# Patient Record
Sex: Female | Born: 1960 | Race: White | Hispanic: No | Smoking: Former smoker
Health system: Southern US, Community
[De-identification: ages and names within clinical notes are randomized; demographics above are authoritative.]

## PROBLEM LIST (undated history)

## (undated) DIAGNOSIS — E785 Hyperlipidemia, unspecified: Secondary | ICD-10-CM

## (undated) DIAGNOSIS — I10 Essential (primary) hypertension: Secondary | ICD-10-CM

## (undated) HISTORY — PX: ABDOMINAL HYSTERECTOMY: SHX81

## (undated) HISTORY — DX: Hyperlipidemia, unspecified: E78.5

## (undated) HISTORY — PX: TONSILLECTOMY: SUR1361

## (undated) HISTORY — DX: Essential (primary) hypertension: I10

---

## 2004-02-17 ENCOUNTER — Ambulatory Visit: Payer: Self-pay | Admitting: Family Medicine

## 2004-02-20 ENCOUNTER — Ambulatory Visit: Payer: Self-pay | Admitting: Family Medicine

## 2004-03-08 ENCOUNTER — Ambulatory Visit: Payer: Self-pay | Admitting: Family Medicine

## 2005-08-15 ENCOUNTER — Ambulatory Visit: Payer: Self-pay | Admitting: Obstetrics and Gynecology

## 2007-03-16 ENCOUNTER — Ambulatory Visit: Payer: Self-pay | Admitting: Obstetrics and Gynecology

## 2007-04-21 ENCOUNTER — Ambulatory Visit: Payer: Self-pay | Admitting: Obstetrics and Gynecology

## 2007-04-26 ENCOUNTER — Inpatient Hospital Stay: Payer: Self-pay | Admitting: Obstetrics and Gynecology

## 2008-01-28 ENCOUNTER — Ambulatory Visit: Payer: Self-pay | Admitting: Internal Medicine

## 2009-02-06 ENCOUNTER — Ambulatory Visit: Payer: Self-pay | Admitting: Internal Medicine

## 2010-11-25 ENCOUNTER — Ambulatory Visit: Payer: Self-pay | Admitting: Internal Medicine

## 2011-01-17 ENCOUNTER — Ambulatory Visit: Payer: Self-pay

## 2012-01-23 ENCOUNTER — Ambulatory Visit: Payer: Self-pay | Admitting: Internal Medicine

## 2014-01-13 ENCOUNTER — Ambulatory Visit: Payer: Self-pay | Admitting: Internal Medicine

## 2014-12-04 ENCOUNTER — Ambulatory Visit
Admission: RE | Admit: 2014-12-04 | Discharge: 2014-12-04 | Disposition: A | Discharge status: Home / Self Care | Payer: BLUE CROSS/BLUE SHIELD | Source: Ambulatory Visit | Attending: Family Medicine | Admitting: Family Medicine

## 2014-12-04 ENCOUNTER — Other Ambulatory Visit: Payer: Self-pay | Admitting: Family Medicine

## 2014-12-04 DIAGNOSIS — R030 Elevated blood-pressure reading, without diagnosis of hypertension: Secondary | ICD-10-CM

## 2015-02-28 ENCOUNTER — Other Ambulatory Visit: Payer: Self-pay | Admitting: Internal Medicine

## 2015-02-28 DIAGNOSIS — Z1231 Encounter for screening mammogram for malignant neoplasm of breast: Secondary | ICD-10-CM

## 2015-05-18 ENCOUNTER — Ambulatory Visit: Payer: BLUE CROSS/BLUE SHIELD | Attending: Internal Medicine

## 2015-08-07 ENCOUNTER — Other Ambulatory Visit: Payer: Self-pay | Admitting: Rheumatology

## 2015-08-07 DIAGNOSIS — M545 Low back pain, unspecified: Secondary | ICD-10-CM

## 2015-08-07 DIAGNOSIS — G8929 Other chronic pain: Secondary | ICD-10-CM

## 2015-08-23 ENCOUNTER — Ambulatory Visit: Payer: BLUE CROSS/BLUE SHIELD

## 2015-09-24 ENCOUNTER — Ambulatory Visit: Payer: BLUE CROSS/BLUE SHIELD

## 2016-10-21 ENCOUNTER — Other Ambulatory Visit: Payer: Self-pay | Admitting: Obstetrics and Gynecology

## 2016-10-21 DIAGNOSIS — Z1231 Encounter for screening mammogram for malignant neoplasm of breast: Secondary | ICD-10-CM

## 2016-11-21 ENCOUNTER — Ambulatory Visit
Admission: RE | Admit: 2016-11-21 | Discharge: 2016-11-21 | Disposition: A | Discharge status: Home / Self Care | Payer: 59 | Source: Ambulatory Visit | Attending: Obstetrics and Gynecology | Admitting: Obstetrics and Gynecology

## 2016-11-21 ENCOUNTER — Encounter: Payer: Self-pay | Admitting: Radiology

## 2016-11-21 DIAGNOSIS — Z1231 Encounter for screening mammogram for malignant neoplasm of breast: Secondary | ICD-10-CM

## 2017-07-16 ENCOUNTER — Ambulatory Visit
Admission: RE | Admit: 2017-07-16 | Discharge: 2017-07-16 | Disposition: A | Discharge status: Home / Self Care | Payer: 59 | Source: Ambulatory Visit | Attending: Internal Medicine | Admitting: Internal Medicine

## 2017-07-16 ENCOUNTER — Other Ambulatory Visit: Payer: Self-pay | Admitting: Internal Medicine

## 2017-07-16 DIAGNOSIS — R600 Localized edema: Secondary | ICD-10-CM | POA: Diagnosis not present

## 2017-07-16 DIAGNOSIS — L539 Erythematous condition, unspecified: Secondary | ICD-10-CM | POA: Diagnosis present

## 2017-09-04 ENCOUNTER — Other Ambulatory Visit: Payer: Self-pay | Admitting: Sports Medicine

## 2017-09-04 DIAGNOSIS — R2241 Localized swelling, mass and lump, right lower limb: Secondary | ICD-10-CM

## 2017-09-04 DIAGNOSIS — M67461 Ganglion, right knee: Secondary | ICD-10-CM

## 2017-09-04 DIAGNOSIS — M7121 Synovial cyst of popliteal space [Baker], right knee: Secondary | ICD-10-CM

## 2017-09-04 DIAGNOSIS — M25561 Pain in right knee: Secondary | ICD-10-CM

## 2017-09-06 ENCOUNTER — Ambulatory Visit
Admission: RE | Admit: 2017-09-06 | Discharge: 2017-09-06 | Disposition: A | Discharge status: Home / Self Care | Payer: 59 | Source: Ambulatory Visit | Attending: Sports Medicine | Admitting: Sports Medicine

## 2017-09-06 ENCOUNTER — Ambulatory Visit
Admission: RE | Admit: 2017-09-06 | Discharge: 2017-09-06 | Disposition: A | Discharge status: Home / Self Care | Payer: BLUE CROSS/BLUE SHIELD | Source: Ambulatory Visit | Attending: Sports Medicine | Admitting: Sports Medicine

## 2017-09-06 DIAGNOSIS — M25561 Pain in right knee: Secondary | ICD-10-CM

## 2017-09-06 DIAGNOSIS — M7121 Synovial cyst of popliteal space [Baker], right knee: Secondary | ICD-10-CM

## 2017-09-06 DIAGNOSIS — R2241 Localized swelling, mass and lump, right lower limb: Secondary | ICD-10-CM

## 2017-09-06 DIAGNOSIS — M67461 Ganglion, right knee: Secondary | ICD-10-CM

## 2017-09-06 MED ORDER — GADOBENATE DIMEGLUMINE 529 MG/ML IV SOLN
18.0000 mL | Freq: Once | INTRAVENOUS | Status: AC | PRN
  Administered 2017-09-06: 18 mL via INTRAVENOUS

## 2018-01-21 ENCOUNTER — Other Ambulatory Visit: Payer: Self-pay

## 2018-01-21 ENCOUNTER — Encounter: Payer: Self-pay | Admitting: Student in an Organized Health Care Education/Training Program

## 2018-01-21 ENCOUNTER — Ambulatory Visit
Payer: 59 | Attending: Student in an Organized Health Care Education/Training Program | Admitting: Student in an Organized Health Care Education/Training Program

## 2018-01-21 VITALS — BP 108/73 | HR 74 | Temp 98.0°F | Resp 16 | Ht 64.0 in | Wt 207.0 lb

## 2018-01-21 DIAGNOSIS — I10 Essential (primary) hypertension: Secondary | ICD-10-CM | POA: Diagnosis not present

## 2018-01-21 DIAGNOSIS — Z885 Allergy status to narcotic agent status: Secondary | ICD-10-CM | POA: Insufficient documentation

## 2018-01-21 DIAGNOSIS — E785 Hyperlipidemia, unspecified: Secondary | ICD-10-CM | POA: Insufficient documentation

## 2018-01-21 DIAGNOSIS — E669 Obesity, unspecified: Secondary | ICD-10-CM | POA: Diagnosis not present

## 2018-01-21 DIAGNOSIS — M25561 Pain in right knee: Secondary | ICD-10-CM | POA: Diagnosis not present

## 2018-01-21 DIAGNOSIS — Z880 Allergy status to penicillin: Secondary | ICD-10-CM | POA: Diagnosis not present

## 2018-01-21 DIAGNOSIS — Z6835 Body mass index (BMI) 35.0-35.9, adult: Secondary | ICD-10-CM

## 2018-01-21 DIAGNOSIS — Z87891 Personal history of nicotine dependence: Secondary | ICD-10-CM | POA: Diagnosis not present

## 2018-01-21 DIAGNOSIS — Z9889 Other specified postprocedural states: Secondary | ICD-10-CM | POA: Diagnosis not present

## 2018-01-21 DIAGNOSIS — Z90710 Acquired absence of both cervix and uterus: Secondary | ICD-10-CM | POA: Diagnosis not present

## 2018-01-21 DIAGNOSIS — M1711 Unilateral primary osteoarthritis, right knee: Secondary | ICD-10-CM | POA: Insufficient documentation

## 2018-01-21 DIAGNOSIS — Z79899 Other long term (current) drug therapy: Secondary | ICD-10-CM | POA: Insufficient documentation

## 2018-01-21 DIAGNOSIS — G894 Chronic pain syndrome: Secondary | ICD-10-CM | POA: Insufficient documentation

## 2018-01-21 DIAGNOSIS — G8929 Other chronic pain: Secondary | ICD-10-CM

## 2018-01-21 NOTE — Patient Instructions (Signed)
____________________________________________________________________________________________  Preparing for Procedure with Sedation  Instructions: . Oral Intake: Do not eat or drink anything for at least 8 hours prior to your procedure. . Transportation: Public transportation is not allowed. Bring an adult driver. The driver must be physically present in our waiting room before any procedure can be started. . Physical Assistance: Bring an adult physically capable of assisting you, in the event you need help. This adult should keep you company at home for at least 6 hours after the procedure. . Blood Pressure Medicine: Take your blood pressure medicine with a sip of water the morning of the procedure. . Blood thinners: Notify our staff if you are taking any blood thinners. Depending on which one you take, there will be specific instructions on how and when to stop it. . Diabetics on insulin: Notify the staff so that you can be scheduled 1st case in the morning. If your diabetes requires high dose insulin, take only  of your normal insulin dose the morning of the procedure and notify the staff that you have done so. . Preventing infections: Shower with an antibacterial soap the morning of your procedure. . Build-up your immune system: Take 1000 mg of Vitamin C with every meal (3 times a day) the day prior to your procedure. . Antibiotics: Inform the staff if you have a condition or reason that requires you to take antibiotics before dental procedures. . Pregnancy: If you are pregnant, call and cancel the procedure. . Sickness: If you have a cold, fever, or any active infections, call and cancel the procedure. . Arrival: You must be in the facility at least 30 minutes prior to your scheduled procedure. . Children: Do not bring children with you. . Dress appropriately: Bring dark clothing that you would not mind if they get stained. . Valuables: Do not bring any jewelry or valuables.  Procedure  appointments are reserved for interventional treatments only. . No Prescription Refills. . No medication changes will be discussed during procedure appointments. . No disability issues will be discussed.  Reasons to call and reschedule or cancel your procedure: (Following these recommendations will minimize the risk of a serious complication.) . Surgeries: Avoid having procedures within 2 weeks of any surgery. (Avoid for 2 weeks before or after any surgery). . Flu Shots: Avoid having procedures within 2 weeks of a flu shots or . (Avoid for 2 weeks before or after immunizations). . Barium: Avoid having a procedure within 7-10 days after having had a radiological study involving the use of radiological contrast. (Myelograms, Barium swallow or enema study). . Heart attacks: Avoid any elective procedures or surgeries for the initial 6 months after a "Myocardial Infarction" (Heart Attack). . Blood thinners: It is imperative that you stop these medications before procedures. Let us know if you if you take any blood thinner.  . Infection: Avoid procedures during or within two weeks of an infection (including chest colds or gastrointestinal problems). Symptoms associated with infections include: Localized redness, fever, chills, night sweats or profuse sweating, burning sensation when voiding, cough, congestion, stuffiness, runny nose, sore throat, diarrhea, nausea, vomiting, cold or Flu symptoms, recent or current infections. It is specially important if the infection is over the area that we intend to treat. . Heart and lung problems: Symptoms that may suggest an active cardiopulmonary problem include: cough, chest pain, breathing difficulties or shortness of breath, dizziness, ankle swelling, uncontrolled high or unusually low blood pressure, and/or palpitations. If you are experiencing any of these symptoms, cancel   your procedure and contact your primary care physician for an evaluation.  Remember:   Regular Business hours are:  Monday to Thursday 8:00 AM to 4:00 PM  Provider's Schedule: Francisco Naveira, MD:  Procedure days: Tuesday and Thursday 7:30 AM to 4:00 PM  Bilal Lateef, MD:  Procedure days: Monday and Wednesday 7:30 AM to 4:00 PM ____________________________________________________________________________________________    

## 2018-01-21 NOTE — Progress Notes (Signed)
Safety precautions to be maintained throughout the outpatient stay will include: orient to surroundings, keep bed in low position, maintain call bell within reach at all times, provide assistance with transfer out of bed and ambulation.  

## 2018-01-21 NOTE — Progress Notes (Signed)
Patient's Name: Lindsay Trevino  MRN: [id number]  Referring Provider: Erin Sons, MD  DOB: [birth year]  PCP: Barbette Reichmann, MD  DOS: 01/21/2018  Note by: Edward Jolly, MD  Service setting: Ambulatory outpatient  Specialty: Interventional Pain Management  Location: [facility] (AMB) Pain Management Facility  Visit type: Initial Patient Evaluation  Patient type: New Patient   Primary Reason(s) for Visit: Encounter for initial evaluation of one or more chronic problems (new to examiner) potentially causing chronic pain, and posing a threat to normal musculoskeletal function. (Level of risk: High) CC: Knee Pain (right)  HPI  Lindsay Trevino is a 57 y.o. year old, female patient, who comes today to see [place] for the first time for an initial evaluation of her chronic pain. She has Primary osteoarthritis of right knee; Chronic pain of right knee; Class 2 obesity with body mass index (BMI) of 35.0 to 35.9 in adult; and Chronic pain syndrome on their problem list. Today she comes in for evaluation of her Knee Pain (right)  Pain Assessment: Location: Right Knee Radiating: denies Onset: More than a month ago Duration: Chronic pain Quality: (annoying) Severity: 2 /10 (subjective, self-reported pain score)  Note: Reported level is compatible with observation.                         When using our objective Pain Scale, levels between 6 and 10/10 are said to belong in an emergency room, as it progressively worsens from a 6/10, described as severely limiting, requiring emergency care not usually available at an outpatient pain management facility. At a 6/10 level, communication becomes difficult and requires great effort. Assistance to reach the emergency department may be required. Facial flushing and profuse sweating along with potentially dangerous increases in heart rate and blood pressure will be evident. Effect on ADL:   Timing: Constant Modifying factors: nothing BP: 108/73  HR: 74  Onset and Duration:  Present longer than 3 months Cause of pain: Unknown Severity: Getting better, NAS-11 at its worse: 10/10, NAS-11 at its best: 2/10, NAS-11 now: 2/10 and NAS-11 on the average: 5/10                 Timing: Night, After activity or exercise and After a period of immobility Aggravating Factors: Kneeling, Prolonged sitting, Squatting, Stooping , Walking uphill and Walking downhill Alleviating Factors: Cold packs, Using a brace and Walking Associated Problems: Night-time cramps Quality of Pain: Aching, Annoying, Intermittent, Cramping, Stabbing and Uncomfortable Previous Examinations or Tests: MRI scan and Orthopedic evaluation Previous Treatments: The patient denies any previous treatments  The patient comes into the clinics today for the first time for a chronic pain management evaluation.    Very pleasant 57 year old female who presents with a chief complaint of right knee pain secondary to right knee osteoarthritis status post Synvisc injections as well as intra-articular right knee injection which was not beneficial for her right knee pain.  Of note patient did [facility] on one occasion for a right knee genicular nerve block.  Patient states that this was an unpleasant and uncomfortable experience for her given lack of information provided by the physician.  She is referred here for repeat genicular nerve block and possible Coolief radio frequency ablation we will focus on interventional pain management only, patient to continue medication management with her primary care provider.   Historic Controlled Substance Pharmacotherapy Review  Historical Background Evaluation: [place] PMP: Six (6) year initial data search conducted.  [place] Department of public safety, offender search: Engineer, mining Information) Non-contributory Risk Assessment Profile: Aberrant behavior: None observed or detected today Risk factors for fatal opioid overdose: None identified today Fatal overdose  hazard ratio (HR): Calculation deferred Non-fatal overdose hazard ratio (HR): Calculation deferred Risk of opioid abuse or dependence: 0.7-3.0% with doses ? 36 MME/day and 6.1-26% with doses ? 120 MME/day. Substance use disorder (SUD) risk level: See below Personal History of Substance Abuse (SUD-Substance use disorder):  Alcohol: Negative  Illegal Drugs: Negative  Rx Drugs: Negative  ORT Risk Level calculation: Low Risk Opioid Risk Tool - 01/21/18 1420      Family History of Substance Abuse   Alcohol  Negative    Illegal Drugs  Negative    Rx Drugs  Negative      Personal History of Substance Abuse   Alcohol  Negative    Illegal Drugs  Negative    Rx Drugs  Negative      Age   Age between 16-45 years   No      History of Preadolescent Sexual Abuse   History of Preadolescent Sexual Abuse  Negative or Female      Psychological Disease   Psychological Disease  Negative    Depression  Negative      Total Score   Opioid Risk Tool Scoring  0    Opioid Risk Interpretation  Low Risk      ORT Scoring interpretation table:  Score <3 = Low Risk for SUD  Score between 4-7 = Moderate Risk for SUD  Score >8 = High Risk for Opioid Abuse   PHQ-2 Depression Scale:  Total score: 0  PHQ-2 Scoring interpretation table: (Score and probability of major depressive disorder)  Score 0 = No depression  Score 1 = 15.4% Probability  Score 2 = 21.1% Probability  Score 3 = 38.4% Probability  Score 4 = 45.5% Probability  Score 5 = 56.4% Probability  Score 6 = 78.6% Probability   PHQ-9 Depression Scale:  Total score: 0  PHQ-9 Scoring interpretation table:  Score 0-4 = No depression  Score 5-9 = Mild depression  Score 10-14 = Moderate depression  Score 15-19 = Moderately severe depression  Score 20-27 = Severe depression (2.4 times higher risk of SUD and 2.89 times higher risk of overuse)   Pharmacologic Plan: Medication management to continue with PCP              Meds   Current  Outpatient Medications:  .  ALPRAZolam (XANAX) 0.25 MG tablet, Take 0.25 mg by mouth as needed for anxiety., Disp: , Rfl:  .  atorvastatin (LIPITOR) 20 MG tablet, Take 20 mg by mouth daily., Disp: , Rfl:  .  butalbital-acetaminophen-caffeine (FIORICET, ESGIC) 50-325-40 MG tablet, Take 1 tablet by mouth 2 (two) times daily as needed for headache., Disp: , Rfl:  .  cyclobenzaprine (FLEXERIL) 5 MG tablet, Take 5 mg by mouth at bedtime., Disp: , Rfl:  .  estradiol (ESTRACE) 1 MG tablet, Take 1 mg by mouth daily., Disp: , Rfl:  .  meloxicam (MOBIC) 15 MG tablet, Take 15 mg by mouth at bedtime., Disp: , Rfl:  .  metoprolol succinate (TOPROL-XL) 12.5 mg TB24 24 hr tablet, Take 12.5 mg by mouth daily., Disp: , Rfl:  .  venlafaxine (EFFEXOR) 75 MG tablet, Take 75 mg by mouth daily., Disp: , Rfl:   Imaging Review   Results for orders placed during the hospital encounter of 09/06/17  MR KNEE RIGHT WO  CONTRAST   Narrative CLINICAL DATA:  Right knee pain and swelling for 3 weeks  EXAM: MRI OF THE RIGHT KNEE WITHOUT CONTRAST  TECHNIQUE: Multiplanar, multisequence MR imaging of the knee was performed. No intravenous contrast was administered.  COMPARISON:  None.  FINDINGS: MENISCI  Medial meniscus:  Intact.  Lateral meniscus: Radial tear of the anterior horn of the lateral meniscus. Severe degeneration of the body of the lateral meniscus.  LIGAMENTS  Cruciates: Intact ACL which is expanded and increased in signal consistent with mucinous degeneration. Intact PCL.  Collaterals: Medial collateral ligament is intact. Lateral collateral ligament complex is intact.  CARTILAGE  Patellofemoral: Mild cartilage fissuring of the lateral patellar facet.  Medial:  No chondral defect.  Lateral: High-grade partial-thickness cartilage loss with areas of full-thickness cartilage loss of the lateral tibial plateau. Mild partial-thickness cartilage loss of the lateral femoral condyle.  Joint:  Large joint effusion. Mild edema in Hoffa's fat. No plical thickening.  Popliteal Fossa: Small Baker's cyst. Fluid superficial to the medial gastrocnemius muscle likely reflecting a leaking Baker cyst. Intact popliteus tendon.  Extensor Mechanism: Intact quadriceps tendon. Intact patellar tendon. Intact medial patellar retinaculum. Intact lateral patellar retinaculum. Intact MPFL.  Bones:  No acute osseous abnormality.  No aggressive osseous lesion.  Other: Muscles are normal.  IMPRESSION: 1. Radial tear of the anterior horn of the lateral meniscus. Severe degeneration of the body of the lateral meniscus. 2. High-grade partial-thickness cartilage loss with areas of full-thickness cartilage loss of the lateral tibial plateau. Mild partial-thickness cartilage loss of the lateral femoral condyle. 3. Mild cartilage fissuring of the lateral patellar facet. 4. Small leaking Baker cyst. 5. Large joint effusion.   Electronically Signed   By: Elige Ko   On: 09/06/2017 17:15      Complexity Note: Imaging results reviewed. Results shared with Lindsay Trevino, using Layman's terms.                         ROS  Cardiovascular: High blood pressure Pulmonary or Respiratory: Snoring  and Temporary stoppage of breathing during sleep Neurological: No reported neurological signs or symptoms such as seizures, abnormal skin sensations, urinary and/or fecal incontinence, being born with an abnormal open spine and/or a tethered spinal cord Review of Past Neurological Studies:  Results for orders placed or performed during the hospital encounter of 12/04/14  CT Head Wo Contrast   Narrative   CLINICAL DATA:  Elevated blood pressure  EXAM: CT HEAD WITHOUT CONTRAST  TECHNIQUE: Contiguous axial images were obtained from the base of the skull through the vertex without intravenous contrast.  COMPARISON:  None currently available  FINDINGS: Skull and Sinuses:Negative for fracture or  destructive process. The mastoids, middle ears, and imaged paranasal sinuses are clear.  Orbits: No acute abnormality.  Brain: No acute or remote infarction, hemorrhage, hydrocephalus, or mass lesion/mass effect. Cerebral volume and white matter appear normal.  IMPRESSION: Normal head CT.   Electronically Signed   By: Marnee Spring M.D.   On: 12/04/2014 11:06    Psychological-Psychiatric: Anxiousness Gastrointestinal: Reflux or heatburn Genitourinary: Passing kidney stones Hematological: Weakness due to low blood hemoglobin or red blood cell count (Anemia), Brusing easily and Bleeding easily Endocrine: No reported endocrine signs or symptoms such as high or low blood sugar, rapid heart rate due to high thyroid levels, obesity or weight gain due to slow thyroid or thyroid disease Rheumatologic: Joint aches and or swelling due to excess weight (Osteoarthritis) and Generalized  muscle aches (Fibromyalgia) Musculoskeletal: Negative for myasthenia gravis, muscular dystrophy, multiple sclerosis or malignant hyperthermia Work History: Working part time  Allergies  Lindsay Trevino is allergic to morphine and related and penicillins.    PFSH  Drug: Lindsay Trevino  reports that she does not use drugs. Alcohol:  reports that she does not drink alcohol. Tobacco:  reports that she has quit smoking. She has never used smokeless tobacco. Medical:  has a past medical history of Hyperlipidemia and Hypertension. Family: family history includes Breast cancer in her cousin.  Past Surgical History:  Procedure Laterality Date  . ABDOMINAL HYSTERECTOMY    . CESAREAN SECTION    . TONSILLECTOMY     Active Ambulatory Problems    Diagnosis Date Noted  . Primary osteoarthritis of right knee 01/21/2018  . Chronic pain of right knee 01/21/2018  . Class 2 obesity with body mass index (BMI) of 35.0 to 35.9 in adult 01/21/2018  . Chronic pain syndrome 01/21/2018   Resolved Ambulatory Problems     Diagnosis Date Noted  . No Resolved Ambulatory Problems   Past Medical History:  Diagnosis Date  . Hyperlipidemia   . Hypertension    Constitutional Exam  General appearance: Well nourished, well developed, and well hydrated. In no apparent acute distress Vitals:   01/21/18 1408  BP: 108/73  Pulse: 74  Resp: 16  Temp: 98 F (36.7 C)  TempSrc: Oral  SpO2: 96%  Weight: 207 lb (93.9 kg)  Height: 5\' 4"  (1.626 m)   BMI Assessment: Estimated body mass index is 35.53 kg/m as calculated from the following:   Height as of this encounter: 5\' 4"  (1.626 m).   Weight as of this encounter: 207 lb (93.9 kg).  BMI interpretation table: BMI level Category Range association with higher incidence of chronic pain  <18 kg/m2 Underweight   18.5-24.9 kg/m2 Ideal body weight   25-29.9 kg/m2 Overweight Increased incidence by 20%  30-34.9 kg/m2 Obese (Class I) Increased incidence by 68%  35-39.9 kg/m2 Severe obesity (Class II) Increased incidence by 136%  >40 kg/m2 Extreme obesity (Class III) Increased incidence by 254%   Patient's current BMI Ideal Body weight  Body mass index is 35.53 kg/m. Ideal body weight: 54.7 kg (120 lb 9.5 oz) Adjusted ideal body weight: 70.4 kg (155 lb 2.5 oz)   BMI Readings from Last 4 Encounters:  01/21/18 35.53 kg/m   Wt Readings from Last 4 Encounters:  01/21/18 207 lb (93.9 kg)  Psych/Mental status: Alert, oriented x 3 (person, place, & time)       Eyes: PERLA Respiratory: No evidence of acute respiratory distress  Cervical Spine Area Exam  Skin & Axial Inspection: No masses, redness, edema, swelling, or associated skin lesions Alignment: Symmetrical Functional ROM: Unrestricted ROM      Stability: No instability detected Muscle Tone/Strength: Functionally intact. No obvious neuro-muscular anomalies detected. Sensory (Neurological): Unimpaired Palpation: No palpable anomalies              Upper Extremity (UE) Exam    Side: Right upper extremity  Side:  Left upper extremity  Skin & Extremity Inspection: Skin color, temperature, and hair growth are WNL. No peripheral edema or cyanosis. No masses, redness, swelling, asymmetry, or associated skin lesions. No contractures.  Skin & Extremity Inspection: Skin color, temperature, and hair growth are WNL. No peripheral edema or cyanosis. No masses, redness, swelling, asymmetry, or associated skin lesions. No contractures.  Functional ROM: Unrestricted ROM  Functional ROM: Unrestricted ROM          Muscle Tone/Strength: Functionally intact. No obvious neuro-muscular anomalies detected.  Muscle Tone/Strength: Functionally intact. No obvious neuro-muscular anomalies detected.  Sensory (Neurological): Unimpaired          Sensory (Neurological): Unimpaired          Palpation: No palpable anomalies              Palpation: No palpable anomalies              Provocative Test(s):  Phalen's test: deferred Tinel's test: deferred Apley's scratch test (touch opposite shoulder):  Action 1 (Across chest): deferred Action 2 (Overhead): deferred Action 3 (LB reach): deferred   Provocative Test(s):  Phalen's test: deferred Tinel's test: deferred Apley's scratch test (touch opposite shoulder):  Action 1 (Across chest): deferred Action 2 (Overhead): deferred Action 3 (LB reach): deferred    Thoracic Spine Area Exam  Skin & Axial Inspection: No masses, redness, or swelling Alignment: Symmetrical Functional ROM: Unrestricted ROM Stability: No instability detected Muscle Tone/Strength: Functionally intact. No obvious neuro-muscular anomalies detected. Sensory (Neurological): Unimpaired Muscle strength & Tone: No palpable anomalies  Lumbar Spine Area Exam  Skin & Axial Inspection: No masses, redness, or swelling Alignment: Symmetrical Functional ROM: Unrestricted ROM       Stability: No instability detected Muscle Tone/Strength: Functionally intact. No obvious neuro-muscular anomalies detected. Sensory  (Neurological): Unimpaired Palpation: No palpable anomalies       Provocative Tests: Hyperextension/rotation test: deferred today       Lumbar quadrant test (Kemp's test): deferred today       Lateral bending test: deferred today       Patrick's Maneuver: deferred today                   FABER test: deferred today                   S-I anterior distraction/compression test: deferred today         S-I lateral compression test: deferred today         S-I Thigh-thrust test: deferred today         S-I Gaenslen's test: deferred today          Gait & Posture Assessment  Ambulation: Unassisted Gait: Relatively normal for age and body habitus Posture: WNL   Lower Extremity Exam    Side: Right lower extremity  Side: Left lower extremity  Stability: No instability observed          Stability: No instability observed          Skin & Extremity Inspection: Some redness observed  Skin & Extremity Inspection: Skin color, temperature, and hair growth are WNL. No peripheral edema or cyanosis. No masses, redness, swelling, asymmetry, or associated skin lesions. No contractures.  Functional ROM: Decreased ROM for knee joint          Functional ROM: Unrestricted ROM                  Muscle Tone/Strength: Functionally intact. No obvious neuro-muscular anomalies detected.  Muscle Tone/Strength: Functionally intact. No obvious neuro-muscular anomalies detected.  Sensory (Neurological): Arthropathic arthralgia  Sensory (Neurological): Unimpaired  Palpation: Complains of area being tender to palpation  Palpation: No palpable anomalies   Assessment  Primary Diagnosis & Pertinent Problem List: The primary encounter diagnosis was Primary osteoarthritis of right knee. Diagnoses of Chronic pain of right knee, Class 2 obesity with body mass index (BMI)  of 35.0 to 35.9 in adult, unspecified obesity type, unspecified whether serious comorbidity present, and Chronic pain syndrome were also pertinent to this  visit.  Visit Diagnosis (New problems to examiner): 1. Primary osteoarthritis of right knee   2. Chronic pain of right knee   3. Class 2 obesity with body mass index (BMI) of 35.0 to 35.9 in adult, unspecified obesity type, unspecified whether serious comorbidity present   4. Chronic pain syndrome    General Recommendations: The pain condition that the patient suffers from is best treated with a multidisciplinary approach that involves an increase in physical activity to prevent de-conditioning and worsening of the pain cycle, as well as psychological counseling (formal and/or informal) to address the co-morbid psychological affects of pain. Treatment will often involve judicious use of pain medications and interventional procedures to decrease the pain, allowing the patient to participate in the physical activity that will ultimately produce long-lasting pain reductions. The goal of the multidisciplinary approach is to return the patient to a higher level of overall function and to restore their ability to perform activities of daily living.  Very pleasant 57 year old female who presents with a chief complaint of right knee pain secondary to right knee osteoarthritis status post Synvisc injections as well as intra-articular right knee injection which was not beneficial for her right knee pain.  Of note patient did [facility] on one occasion for a right knee genicular nerve block.  Patient states that this was an unpleasant and uncomfortable experience for her given lack of information provided by the physician.  She is referred here for repeat genicular nerve block and possible Coolief radio frequency ablation we will focus on interventional pain management only, patient to continue medication management with her primary care provider.  Plan: -Right knee genicular nerve block under fluoroscopy with moderate sedation.  Will consider Coolief RFA depending upon genicular nerve block  results. -Medication management to continue with PCP.  Ordered Lab-work, Procedure(s), Referral(s), & Consult(s): Orders Placed This Encounter  Procedures  . GENICULAR NERVE BLOCK    Provider-requested follow-up: Return in about 3 weeks (around 02/11/2018) for Procedure.  No future appointments.  Primary Care Physician: Barbette Reichmann, MD Location: [facility] Outpatient Pain Management Facility Note by: Edward Jolly, M.D, Date: 01/21/2018; Time: 3:38 PM  Patient Instructions  ____________________________________________________________________________________________  Preparing for Procedure with Sedation  Instructions: . Oral Intake: Do not eat or drink anything for at least 8 hours prior to your procedure. . Transportation: Public transportation is not allowed. Bring an adult driver. The driver must be physically present in our waiting room before any procedure can be started. Marland Kitchen Physical Assistance: Bring an adult physically capable of assisting you, in the event you need help. This adult should keep you company at home for at least 6 hours after the procedure. . Blood Pressure Medicine: Take your blood pressure medicine with a sip of water the morning of the procedure. . Blood thinners: Notify our staff if you are taking any blood thinners. Depending on which one you take, there will be specific instructions on how and when to stop it. . Diabetics on insulin: Notify the staff so that you can be scheduled 1st case in the morning. If your diabetes requires high dose insulin, take only  of your normal insulin dose the morning of the procedure and notify the staff that you have done so. . Preventing infections: Shower with an antibacterial soap the morning of your procedure. . Build-up your immune system: Take 1000  mg of Vitamin C with every meal (3 times a day) the day prior to your procedure. Marland Kitchen Antibiotics: Inform the staff if you have a condition or reason that requires you to take  antibiotics before dental procedures. . Pregnancy: If you are pregnant, call and cancel the procedure. . Sickness: If you have a cold, fever, or any active infections, call and cancel the procedure. . Arrival: You must be in the facility at least 30 minutes prior to your scheduled procedure. . Children: Do not bring children with you. . Dress appropriately: Bring dark clothing that you would not mind if they get stained. . Valuables: Do not bring any jewelry or valuables.  Procedure appointments are reserved for interventional treatments only. Marland Kitchen No Prescription Refills. . No medication changes will be discussed during procedure appointments. . No disability issues will be discussed.  Reasons to call and reschedule or cancel your procedure: (Following these recommendations will minimize the risk of a serious complication.) . Surgeries: Avoid having procedures within 2 weeks of any surgery. (Avoid for 2 weeks before or after any surgery). . Flu Shots: Avoid having procedures within 2 weeks of a flu shots or . (Avoid for 2 weeks before or after immunizations). . Barium: Avoid having a procedure within 7-10 days after having had a radiological study involving the use of radiological contrast. (Myelograms, Barium swallow or enema study). . Heart attacks: Avoid any elective procedures or surgeries for the initial 6 months after a "Myocardial Infarction" (Heart Attack). . Blood thinners: It is imperative that you stop these medications before procedures. Let us know if you if you take any blood thinner.  . Infection: Avoid procedures during or within two weeks of an infection (including chest colds or gastrointestinal problems). Symptoms associated with infections include: Localized redness, fever, chills, night sweats or profuse sweating, burning sensation when voiding, cough, congestion, stuffiness, runny nose, sore throat, diarrhea, nausea, vomiting, cold or Flu symptoms, recent or current infections.  It is specially important if the infection is over the area that we intend to treat. Marland Kitchen Heart and lung problems: Symptoms that may suggest an active cardiopulmonary problem include: cough, chest pain, breathing difficulties or shortness of breath, dizziness, ankle swelling, uncontrolled high or unusually low blood pressure, and/or palpitations. If you are experiencing any of these symptoms, cancel your procedure and contact your primary care physician for an evaluation.  Remember:  Regular Business hours are:  Monday to Thursday 8:00 AM to 4:00 PM  Provider's Schedule: Delano Metz, MD:  Procedure days: Tuesday and Thursday 7:30 AM to 4:00 PM  Edward Jolly, MD:  Procedure days: Monday and Wednesday 7:30 AM to 4:00 PM ____________________________________________________________________________________________

## 2018-02-03 ENCOUNTER — Other Ambulatory Visit: Payer: Self-pay

## 2018-02-03 ENCOUNTER — Encounter: Payer: Self-pay | Admitting: Student in an Organized Health Care Education/Training Program

## 2018-02-03 ENCOUNTER — Ambulatory Visit (HOSPITAL_BASED_OUTPATIENT_CLINIC_OR_DEPARTMENT_OTHER): Payer: 59 | Admitting: Student in an Organized Health Care Education/Training Program

## 2018-02-03 ENCOUNTER — Ambulatory Visit
Admission: RE | Admit: 2018-02-03 | Discharge: 2018-02-03 | Disposition: A | Discharge status: Home / Self Care | Payer: 59 | Source: Ambulatory Visit | Attending: Student in an Organized Health Care Education/Training Program | Admitting: Student in an Organized Health Care Education/Training Program

## 2018-02-03 VITALS — BP 156/75 | HR 76 | Temp 96.4°F | Resp 18 | Ht 64.5 in | Wt 215.0 lb

## 2018-02-03 DIAGNOSIS — G8929 Other chronic pain: Secondary | ICD-10-CM | POA: Diagnosis not present

## 2018-02-03 DIAGNOSIS — Z885 Allergy status to narcotic agent status: Secondary | ICD-10-CM | POA: Insufficient documentation

## 2018-02-03 DIAGNOSIS — Z880 Allergy status to penicillin: Secondary | ICD-10-CM | POA: Diagnosis not present

## 2018-02-03 DIAGNOSIS — M25561 Pain in right knee: Secondary | ICD-10-CM

## 2018-02-03 DIAGNOSIS — Z79899 Other long term (current) drug therapy: Secondary | ICD-10-CM | POA: Insufficient documentation

## 2018-02-03 DIAGNOSIS — M1711 Unilateral primary osteoarthritis, right knee: Secondary | ICD-10-CM

## 2018-02-03 MED ORDER — ROPIVACAINE HCL 2 MG/ML IJ SOLN
10.0000 mL | Freq: Once | INTRAMUSCULAR | Status: AC
  Administered 2018-02-03: 10 mL
  Filled 2018-02-03: qty 10

## 2018-02-03 MED ORDER — LACTATED RINGERS IV SOLN
1000.0000 mL | Freq: Once | INTRAVENOUS | Status: AC
  Administered 2018-02-03: 1000 mL via INTRAVENOUS

## 2018-02-03 MED ORDER — LIDOCAINE HCL 2 % IJ SOLN
20.0000 mL | Freq: Once | INTRAMUSCULAR | Status: AC
  Administered 2018-02-03: 400 mg
  Filled 2018-02-03: qty 20

## 2018-02-03 MED ORDER — DEXAMETHASONE SODIUM PHOSPHATE 10 MG/ML IJ SOLN
10.0000 mg | Freq: Once | INTRAMUSCULAR | Status: AC
  Administered 2018-02-03: 10 mg
  Filled 2018-02-03: qty 1

## 2018-02-03 MED ORDER — FENTANYL CITRATE (PF) 100 MCG/2ML IJ SOLN
25.0000 ug | INTRAMUSCULAR | Status: DC | PRN
  Filled 2018-02-03: qty 2

## 2018-02-03 MED ORDER — MIDAZOLAM HCL 5 MG/5ML IJ SOLN
INTRAMUSCULAR | Status: AC
  Filled 2018-02-03: qty 5

## 2018-02-03 MED ORDER — MIDAZOLAM HCL 5 MG/5ML IJ SOLN
1.0000 mg | INTRAMUSCULAR | Status: DC | PRN
  Administered 2018-02-03: 1 mg via INTRAVENOUS

## 2018-02-03 NOTE — Patient Instructions (Signed)

## 2018-02-03 NOTE — Progress Notes (Signed)
Safety precautions to be maintained throughout the outpatient stay will include: orient to surroundings, keep bed in low position, maintain call bell within reach at all times, provide assistance with transfer out of bed and ambulation.  

## 2018-02-03 NOTE — Progress Notes (Signed)
Patient's Name: Lindsay Trevino  MRN: [id number]  Referring Provider: Edward Jolly, MD  DOB: Sep 29, 1960  PCP: Barbette Reichmann, MD  DOS: 02/03/2018  Note by: Edward Jolly, MD  Service setting: Ambulatory outpatient  Specialty: Interventional Pain Management  Patient type: Established  Location: [facility] (AMB) Pain Management Facility  Visit type: Interventional Procedure   Primary Reason for Visit: Interventional Pain Management Treatment. CC: Knee Pain (right)  Procedure:          Anesthesia, Analgesia, Anxiolysis:  Type: Genicular Nerves Block (Superior-lateral, Superior-medial, and Inferior-medial Genicular Nerves)          CPT: 64450      Primary Purpose: Diagnostic Region: Lateral, Anterior, and Medial aspects of the knee joint, above and below the knee joint proper. Level: Superior and inferior to the knee joint. Target Area: For Genicular Nerve block(s), the targets are: the superior-lateral genicular nerve, located in the lateral distal portion of the femoral shaft as it curves to form the lateral epicondyle, in the region of the distal femoral metaphysis; the superior-medial genicular nerve, located in the medial distal portion of the femoral shaft as it curves to form the medial epicondyle; and the inferior-medial genicular nerve, located in the medial, proximal portion of the tibial shaft, as it curves to form the medial epicondyle, in the region of the proximal tibial metaphysis. Approach: Anterior, percutaneous, ipsilateral approach. Laterality: Right knee  Type: Moderate (Conscious) Sedation combined with Local Anesthesia Indication(s): Analgesia and Anxiety Route: Intravenous (IV) IV Access: Secured Sedation: Meaningful verbal contact was maintained at all times during the procedure  Local Anesthetic: Lidocaine 1-2%  Position: Modified Fowler's position with pillows under the targeted knee(s).   Indications: 1. Chronic pain of right knee   2. Primary osteoarthritis of right  knee    Pain Score: Pre-procedure: 4 /10 Post-procedure: 0-No pain/10  Pre-op Assessment:  Lindsay Trevino is a 57 y.o. (year old), female patient, seen today for interventional treatment. She  has a past surgical history that includes Cesarean section; Abdominal hysterectomy; and Tonsillectomy. Lindsay Trevino has a current medication list which includes the following prescription(s): alprazolam, atorvastatin, butalbital-acetaminophen-caffeine, cyclobenzaprine, estradiol, meloxicam, metoprolol succinate, and venlafaxine, and the following Facility-Administered Medications: fentanyl and midazolam. Her primarily concern today is the Knee Pain (right)  Initial Vital Signs:  Pulse/HCG Rate: 76ECG Heart Rate: 71 Temp: 98 F (36.7 C) Resp: 16 BP: 116/88 SpO2: 98 %  BMI: Estimated body mass index is 36.33 kg/m as calculated from the following:   Height as of this encounter: 5' 4.5" (1.638 m).   Weight as of this encounter: 215 lb (97.5 kg).  Risk Assessment: Allergies: Reviewed. She is allergic to morphine and related and penicillins.  Allergy Precautions: None required Coagulopathies: Reviewed. None identified.  Blood-thinner therapy: None at this time Active Infection(s): Reviewed. None identified. Lindsay Trevino is afebrile  Site Confirmation: Lindsay Trevino was asked to confirm the procedure and laterality before marking the site Procedure checklist: Completed Consent: Before the procedure and under the influence of no sedative(s), amnesic(s), or anxiolytics, the patient was informed of the treatment options, risks and possible complications. To fulfill our ethical and legal obligations, as recommended by the American Medical Association's Code of Ethics, I have informed the patient of my clinical impression; the nature and purpose of the treatment or procedure; the risks, benefits, and possible complications of the intervention; the alternatives, including doing nothing; the risk(s) and  benefit(s) of the alternative treatment(s) or procedure(s); and the risk(s) and benefit(s) of doing nothing.  The patient was provided information about the general risks and possible complications associated with the procedure. These may include, but are not limited to: failure to achieve desired goals, infection, bleeding, organ or nerve damage, allergic reactions, paralysis, and death. In addition, the patient was informed of those risks and complications associated to the procedure, such as failure to decrease pain; infection; bleeding; organ or nerve damage with subsequent damage to sensory, motor, and/or autonomic systems, resulting in permanent pain, numbness, and/or weakness of one or several areas of the body; allergic reactions; (i.e.: anaphylactic reaction); and/or death. Furthermore, the patient was informed of those risks and complications associated with the medications. These include, but are not limited to: allergic reactions (i.e.: anaphylactic or anaphylactoid reaction(s)); adrenal axis suppression; blood sugar elevation that in diabetics may result in ketoacidosis or comma; water retention that in patients with history of congestive heart failure may result in shortness of breath, pulmonary edema, and decompensation with resultant heart failure; weight gain; swelling or edema; medication-induced neural toxicity; particulate matter embolism and blood vessel occlusion with resultant organ, and/or nervous system infarction; and/or aseptic necrosis of one or more joints. Finally, the patient was informed that Medicine is not an exact science; therefore, there is also the possibility of unforeseen or unpredictable risks and/or possible complications that may result in a catastrophic outcome. The patient indicated having understood very clearly. We have given the patient no guarantees and we have made no promises. Enough time was given to the patient to ask questions, all of which were answered to  the patient's satisfaction. Lindsay Trevino has indicated that she wanted to continue with the procedure. Attestation: I, the ordering provider, attest that I have discussed with the patient the benefits, risks, side-effects, alternatives, likelihood of achieving goals, and potential problems during recovery for the procedure that I have provided informed consent. Date  Time: 02/03/2018  8:49 AM  Pre-Procedure Preparation:  Monitoring: As per clinic protocol. Respiration, ETCO2, SpO2, BP, heart rate and rhythm monitor placed and checked for adequate function Safety Precautions: Patient was assessed for positional comfort and pressure points before starting the procedure. Time-out: I initiated and conducted the "Time-out" before starting the procedure, as per protocol. The patient was asked to participate by confirming the accuracy of the "Time Out" information. Verification of the correct person, site, and procedure were performed and confirmed by me, the nursing staff, and the patient. "Time-out" conducted as per Joint Commission's Universal Protocol (UP.01.01.01). Time: [id number]  Description of Procedure:          Area Prepped: Entire knee area, from mid-thigh to mid-shin, lateral, anterior, and medial aspects. Prepping solution: ChloraPrep (2% chlorhexidine gluconate and 70% isopropyl alcohol) Safety Precautions: Aspiration looking for blood return was conducted prior to all injections. At no point did we inject any substances, as a needle was being advanced. No attempts were made at seeking any paresthesias. Safe injection practices and needle disposal techniques used. Medications properly checked for expiration dates. SDV (single dose vial) medications used. Description of the Procedure: Protocol guidelines were followed. The patient was placed in position over the procedure table. The target area was identified and the area prepped in the usual manner. Skin & deeper tissues infiltrated with local  anesthetic. Appropriate amount of time allowed to pass for local anesthetics to take effect. The procedure needles were then advanced to the target area. Proper needle placement secured. Negative aspiration confirmed. Solution injected in intermittent fashion, asking for systemic symptoms every 0.5cc of injectate. The needles  were then removed and the area cleansed, making sure to leave some of the prepping solution back to take advantage of its long term bactericidal properties.  Vitals:   02/03/18 1003 02/03/18 1012 02/03/18 1022 02/03/18 1032  BP: (!) 157/100 (!) 166/90 (!) 171/86 (!) 156/75  Pulse:      Resp: 15 16 16 18   Temp:  (!) 97.1 F (36.2 C)  (!) 96.4 F (35.8 C)  TempSrc:      SpO2: 100% 96% 96% 99%  Weight:      Height:        Start Time: 0953 hrs. End Time: 1003 hrs. Materials:  Needle(s) Type: Spinal Needle Gauge: 22G Length: 3.5-in Medication(s): Please see orders for medications and dosing details. 5 cc solution made of 4 cc of 0.2% ropivacaine, 1 cc of Decadron 10 mg/cc.  1.5 cc injected at each level above. Imaging Guidance (Non-Spinal):          Type of Imaging Technique: Fluoroscopy Guidance (Non-Spinal) Indication(s): Assistance in needle guidance and placement for procedures requiring needle placement in or near specific anatomical locations not easily accessible without such assistance. Exposure Time: Please see nurses notes. Contrast: Before injecting any contrast, we confirmed that the patient did not have an allergy to iodine, shellfish, or radiological contrast. Once satisfactory needle placement was completed at the desired level, radiological contrast was injected. Contrast injected under live fluoroscopy. No contrast complications. See chart for type and volume of contrast used. Fluoroscopic Guidance: I was personally present during the use of fluoroscopy. "Tunnel Vision Technique" used to obtain the best possible view of the target area. Parallax error  corrected before commencing the procedure. "Direction-depth-direction" technique used to introduce the needle under continuous pulsed fluoroscopy. Once target was reached, antero-posterior, oblique, and lateral fluoroscopic projection used confirm needle placement in all planes. Images permanently stored in EMR. Interpretation: I personally interpreted the imaging intraoperatively. Adequate needle placement confirmed in multiple planes. Appropriate spread of contrast into desired area was observed. No evidence of afferent or efferent intravascular uptake. Permanent images saved into the patient's record.  Antibiotic Prophylaxis:   Anti-infectives (From admission, onward)   None     Indication(s): None identified  Post-operative Assessment:  Post-procedure Vital Signs:  Pulse/HCG Rate: 7667 Temp: (!) 96.4 F (35.8 C) Resp: 18 BP: (!) 156/75 SpO2: 99 %  EBL: None  Complications: No immediate post-treatment complications observed by team, or reported by patient.  Note: The patient tolerated the entire procedure well. A repeat set of vitals were taken after the procedure and the patient was kept under observation following institutional policy, for this type of procedure. Post-procedural neurological assessment was performed, showing return to baseline, prior to discharge. The patient was provided with post-procedure discharge instructions, including a section on how to identify potential problems. Should any problems arise concerning this procedure, the patient was given instructions to immediately contact us, at any time, without hesitation. In any case, we plan to contact the patient by telephone for a follow-up status report regarding this interventional procedure.  Comments:  No additional relevant information. 5 out of 5 strength bilateral lower extremity: Plantar flexion, dorsiflexion, knee flexion, knee extension.  Plan of Care    Imaging Orders     DG C-Arm 1-60 Min-No  Report Procedure Orders    No procedure(s) ordered today    Medications ordered for procedure: Meds ordered this encounter  Medications  . lactated ringers infusion 1,000 mL  . fentaNYL (SUBLIMAZE) injection 25-100 mcg  Make sure Narcan is available in the pyxis when using this medication. In the event of respiratory depression (RR< 8/min): Titrate NARCAN (naloxone) in increments of 0.1 to 0.2 mg IV at 2-3 minute intervals, until desired degree of reversal.  . ropivacaine (PF) 2 mg/mL (0.2%) (NAROPIN) injection 10 mL  . lidocaine (XYLOCAINE) 2 % (with pres) injection 400 mg  . dexamethasone (DECADRON) injection 10 mg  . midazolam (VERSED) 5 MG/5ML injection 1-2 mg    Make sure Flumazenil is available in the pyxis when using this medication. If oversedation occurs, administer 0.2 mg IV over 15 sec. If after 45 sec no response, administer 0.2 mg again over 1 min; may repeat at 1 min intervals; not to exceed 4 doses (1 mg)   Medications administered: We administered lactated ringers, ropivacaine (PF) 2 mg/mL (0.2%), lidocaine, dexamethasone, and midazolam.  See the medical record for exact dosing, route, and time of administration.  Disposition: Discharge home  Discharge Date & Time: 02/03/2018; 1037 hrs.   Physician-requested Follow-up: Return in about 4 weeks (around 03/03/2018) for Post Procedure Evaluation.  Future Appointments  Date Time Provider Department Center  03/03/2018  1:30 PM Edward Jolly, MD [facility] None   Primary Care Physician: Barbette Reichmann, MD Location: [facility] Note by: Edward Jolly, MD Date: 02/03/2018; Time: 10:43 AM  Disclaimer:  Medicine is not an exact science. The only guarantee in medicine is that nothing is guaranteed. It is important to note that the decision to proceed with this intervention was based on the information collected from the patient. The Data and conclusions were drawn from the patient's  questionnaire, the interview, and the physical examination. Because the information was provided in large part by the patient, it cannot be guaranteed that it has not been purposely or unconsciously manipulated. Every effort has been made to obtain as much relevant data as possible for this evaluation. It is important to note that the conclusions that lead to this procedure are derived in large part from the available data. Always take into account that the treatment will also be dependent on availability of resources and existing treatment guidelines, considered by other Pain Management Practitioners as being common knowledge and practice, at the time of the intervention. For Medico-Legal purposes, it is also important to point out that variation in procedural techniques and pharmacological choices are the acceptable norm. The indications, contraindications, technique, and results of the above procedure should only be interpreted and judged by a Board-Certified Interventional Pain Specialist with extensive familiarity and expertise in the same exact procedure and technique.

## 2018-02-04 ENCOUNTER — Other Ambulatory Visit: Payer: Self-pay | Admitting: Obstetrics and Gynecology

## 2018-02-04 DIAGNOSIS — Z1231 Encounter for screening mammogram for malignant neoplasm of breast: Secondary | ICD-10-CM

## 2018-02-10 ENCOUNTER — Ambulatory Visit: Payer: 59 | Admitting: Student in an Organized Health Care Education/Training Program

## 2018-02-17 ENCOUNTER — Telehealth: Payer: Self-pay

## 2018-02-17 NOTE — Telephone Encounter (Signed)
Pt called and stated that she thinks she needs to return sooner than her appt for the 20th she said her knee pain isn't any better. Rescheduled appt to 11/12

## 2018-02-23 ENCOUNTER — Other Ambulatory Visit: Payer: Self-pay

## 2018-02-23 ENCOUNTER — Ambulatory Visit
Payer: 59 | Attending: Student in an Organized Health Care Education/Training Program | Admitting: Student in an Organized Health Care Education/Training Program

## 2018-02-23 ENCOUNTER — Encounter: Payer: Self-pay | Admitting: Student in an Organized Health Care Education/Training Program

## 2018-02-23 VITALS — BP 135/84 | HR 84 | Temp 98.0°F | Resp 18 | Ht 64.0 in | Wt 200.0 lb

## 2018-02-23 DIAGNOSIS — Z885 Allergy status to narcotic agent status: Secondary | ICD-10-CM | POA: Insufficient documentation

## 2018-02-23 DIAGNOSIS — M25561 Pain in right knee: Secondary | ICD-10-CM | POA: Diagnosis not present

## 2018-02-23 DIAGNOSIS — Z880 Allergy status to penicillin: Secondary | ICD-10-CM | POA: Diagnosis not present

## 2018-02-23 DIAGNOSIS — M1711 Unilateral primary osteoarthritis, right knee: Secondary | ICD-10-CM | POA: Insufficient documentation

## 2018-02-23 DIAGNOSIS — Z6835 Body mass index (BMI) 35.0-35.9, adult: Secondary | ICD-10-CM | POA: Diagnosis not present

## 2018-02-23 DIAGNOSIS — Z87891 Personal history of nicotine dependence: Secondary | ICD-10-CM | POA: Insufficient documentation

## 2018-02-23 DIAGNOSIS — Z79899 Other long term (current) drug therapy: Secondary | ICD-10-CM | POA: Insufficient documentation

## 2018-02-23 DIAGNOSIS — G894 Chronic pain syndrome: Secondary | ICD-10-CM | POA: Diagnosis present

## 2018-02-23 DIAGNOSIS — I10 Essential (primary) hypertension: Secondary | ICD-10-CM | POA: Insufficient documentation

## 2018-02-23 DIAGNOSIS — E669 Obesity, unspecified: Secondary | ICD-10-CM | POA: Insufficient documentation

## 2018-02-23 DIAGNOSIS — G8929 Other chronic pain: Secondary | ICD-10-CM | POA: Diagnosis not present

## 2018-02-23 DIAGNOSIS — Z791 Long term (current) use of non-steroidal anti-inflammatories (NSAID): Secondary | ICD-10-CM | POA: Insufficient documentation

## 2018-02-23 DIAGNOSIS — E785 Hyperlipidemia, unspecified: Secondary | ICD-10-CM | POA: Diagnosis not present

## 2018-02-23 NOTE — Patient Instructions (Signed)

## 2018-02-23 NOTE — Progress Notes (Signed)
Patient's Name: Lindsay Trevino  MRN: [id number]  Referring Provider: Barbette Reichmann, MD  DOB: 05-08-1960  PCP: Barbette Reichmann, MD  DOS: 02/23/2018  Note by: Edward Jolly, MD  Service setting: Ambulatory outpatient  Specialty: Interventional Pain Management  Location: [facility] (AMB) Pain Management Facility    Patient type: Established   Primary Reason(s) for Visit: Encounter for post-procedure evaluation of chronic illness with mild to moderate exacerbation CC: Knee Pain (right)  HPI  Lindsay Trevino is a 57 y.o. year old, female patient, who comes today for a post-procedure evaluation. She has Primary osteoarthritis of right knee; Chronic pain of right knee; Class 2 obesity with body mass index (BMI) of 35.0 to 35.9 in adult; and Chronic pain syndrome on their problem list. Her primarily concern today is the Knee Pain (right)  Pain Assessment: Location: Right Knee Radiating: denies Onset: More than a month ago Duration: Chronic pain Quality: Aching, Burning, Sharp, Shooting, Constant Severity: 9 /10 (subjective, self-reported pain score)  Note: Reported level is inconsistent with clinical observations. Clinically the patient looks like a 3/10 A 3/10 is viewed as "Moderate" and described as significantly interfering with activities of daily living (ADL). It becomes difficult to feed, bathe, get dressed, get on and off the toilet or to perform personal hygiene functions. Difficult to get in and out of bed or a chair without assistance. Very distracting. With effort, it can be ignored when deeply involved in activities.       When using our objective Pain Scale, levels between 6 and 10/10 are said to belong in an emergency room, as it progressively worsens from a 6/10, described as severely limiting, requiring emergency care not usually available at an outpatient pain management facility. At a 6/10 level, communication becomes difficult and requires great effort. Assistance to reach the emergency  department may be required. Facial flushing and profuse sweating along with potentially dangerous increases in heart rate and blood pressure will be evident. Effect on ADL: Limits activities Timing: Constant Modifying factors: denies BP: 135/84  HR: 84  Lindsay Trevino comes in today for post-procedure evaluation.  Further details on both, my assessment(s), as well as the proposed treatment plan, please see below.  Post-Procedure Assessment  02/03/2018 Procedure: Right knee genicular nerve block Pre-procedure pain score:  4/10 Post-procedure pain score: 0/10         Influential Factors: BMI: 34.33 kg/m Intra-procedural challenges: None observed.         Assessment challenges: None detected.              Reported side-effects: None.        Post-procedural adverse reactions or complications: None reported         Sedation: Please see nurses note. When no sedatives are used, the analgesic levels obtained are directly associated to the effectiveness of the local anesthetics. However, when sedation is provided, the level of analgesia obtained during the initial 1 hour following the intervention, is believed to be the result of a combination of factors. These factors may include, but are not limited to: 1. The effectiveness of the local anesthetics used. 2. The effects of the analgesic(s) and/or anxiolytic(s) used. 3. The degree of discomfort experienced by the patient at the time of the procedure. 4. The patients ability and reliability in recalling and recording the events. 5. The presence and influence of possible secondary gains and/or psychosocial factors. Reported result: Relief experienced during the 1st hour after the procedure: 100 % (Ultra-Short Term Relief)  Interpretative annotation: Clinically appropriate result. Analgesia during this period is likely to be Local Anesthetic and/or IV Sedative (Analgesic/Anxiolytic) related.          Effects of local anesthetic: The  analgesic effects attained during this period are directly associated to the localized infiltration of local anesthetics and therefore cary significant diagnostic value as to the etiological location, or anatomical origin, of the pain. Expected duration of relief is directly dependent on the pharmacodynamics of the local anesthetic used. Long-acting (4-6 hours) anesthetics used.  Reported result: Relief during the next 4 to 6 hour after the procedure: 100 % (Short-Term Relief)            Interpretative annotation: Clinically appropriate result. Analgesia during this period is likely to be Local Anesthetic-related.          Long-term benefit: Defined as the period of time past the expected duration of local anesthetics (1 hour for short-acting and 4-6 hours for long-acting). With the possible exception of prolonged sympathetic blockade from the local anesthetics, benefits during this period are typically attributed to, or associated with, other factors such as analgesic sensory neuropraxia, antiinflammatory effects, or beneficial biochemical changes provided by agents other than the local anesthetics.  Reported result: Extended relief following procedure: 75 %(lasting 2 weeks) (Long-Term Relief)            Interpretative annotation: Clinically possible results. Good relief. No permanent benefit expected. Inflammation plays a part in the etiology to the pain.          Current benefits: Defined as reported results that persistent at this point in time.   Analgesia: 25-50 %            Function: Somewhat improved ROM: Somewhat improved Interpretative annotation: Recurrence of symptoms. No permanent benefit expected. Effective diagnostic intervention.          Interpretation: Results would suggest a successful diagnostic intervention.                  Plan:  Repeat treatment or therapy and compare extent and duration of benefits.                 Meds   Current Outpatient Medications:  .  ALPRAZolam  (XANAX) 0.25 MG tablet, Take 0.25 mg by mouth as needed for anxiety., Disp: , Rfl:  .  atorvastatin (LIPITOR) 20 MG tablet, Take 20 mg by mouth daily., Disp: , Rfl:  .  butalbital-acetaminophen-caffeine (FIORICET, ESGIC) 50-325-40 MG tablet, Take 1 tablet by mouth 2 (two) times daily as needed for headache., Disp: , Rfl:  .  cyclobenzaprine (FLEXERIL) 5 MG tablet, Take 5 mg by mouth at bedtime., Disp: , Rfl:  .  estradiol (ESTRACE) 1 MG tablet, Take 1 mg by mouth daily., Disp: , Rfl:  .  meloxicam (MOBIC) 15 MG tablet, Take 15 mg by mouth at bedtime., Disp: , Rfl:  .  metoprolol succinate (TOPROL-XL) 12.5 mg TB24 24 hr tablet, Take 12.5 mg by mouth daily., Disp: , Rfl:  .  venlafaxine (EFFEXOR) 75 MG tablet, Take 75 mg by mouth daily., Disp: , Rfl:   ROS  Constitutional: Denies any fever or chills Gastrointestinal: No reported hemesis, hematochezia, vomiting, or acute GI distress Musculoskeletal: Denies any acute onset joint swelling, redness, loss of ROM, or weakness Neurological: No reported episodes of acute onset apraxia, aphasia, dysarthria, agnosia, amnesia, paralysis, loss of coordination, or loss of consciousness  Allergies  Ms. Saez is allergic to morphine and related and penicillins.  PFSH  Drug: Ms. Florance  reports that she does not use drugs. Alcohol:  reports that she does not drink alcohol. Tobacco:  reports that she has quit smoking. She has never used smokeless tobacco. Medical:  has a past medical history of Hyperlipidemia and Hypertension. Surgical: Ms. Kouns  has a past surgical history that includes Cesarean section; Abdominal hysterectomy; and Tonsillectomy. Family: family history includes Breast cancer in her cousin.  Constitutional Exam  General appearance: Well nourished, well developed, and well hydrated. In no apparent acute distress Vitals:   02/23/18 1139 02/23/18 1142  BP:  135/84  Pulse: 84   Resp: 18   Temp: 98 F (36.7 C)   SpO2: 96%    Weight: 200 lb (90.7 kg)   Height: 5\' 4"  (1.626 m)    BMI Assessment: Estimated body mass index is 34.33 kg/m as calculated from the following:   Height as of this encounter: 5\' 4"  (1.626 m).   Weight as of this encounter: 200 lb (90.7 kg).  BMI interpretation table: BMI level Category Range association with higher incidence of chronic pain  <18 kg/m2 Underweight   18.5-24.9 kg/m2 Ideal body weight   25-29.9 kg/m2 Overweight Increased incidence by 20%  30-34.9 kg/m2 Obese (Class I) Increased incidence by 68%  35-39.9 kg/m2 Severe obesity (Class II) Increased incidence by 136%  >40 kg/m2 Extreme obesity (Class III) Increased incidence by 254%   Patient's current BMI Ideal Body weight  Body mass index is 34.33 kg/m. Ideal body weight: 54.7 kg (120 lb 9.5 oz) Adjusted ideal body weight: 69.1 kg (152 lb 5.7 oz)   BMI Readings from Last 4 Encounters:  02/23/18 34.33 kg/m  02/03/18 36.33 kg/m  01/21/18 35.53 kg/m   Wt Readings from Last 4 Encounters:  02/23/18 200 lb (90.7 kg)  02/03/18 215 lb (97.5 kg)  01/21/18 207 lb (93.9 kg)  Psych/Mental status: Alert, oriented x 3 (person, place, & time)       Eyes: PERLA Respiratory: No evidence of acute respiratory distress  Cervical Spine Area Exam  Skin & Axial Inspection: No masses, redness, edema, swelling, or associated skin lesions Alignment: Symmetrical Functional ROM: Unrestricted ROM      Stability: No instability detected Muscle Tone/Strength: Functionally intact. No obvious neuro-muscular anomalies detected. Sensory (Neurological): Unimpaired Palpation: No palpable anomalies              Upper Extremity (UE) Exam    Side: Right upper extremity  Side: Left upper extremity  Skin & Extremity Inspection: Skin color, temperature, and hair growth are WNL. No peripheral edema or cyanosis. No masses, redness, swelling, asymmetry, or associated skin lesions. No contractures.  Skin & Extremity Inspection: Skin color,  temperature, and hair growth are WNL. No peripheral edema or cyanosis. No masses, redness, swelling, asymmetry, or associated skin lesions. No contractures.  Functional ROM: Unrestricted ROM          Functional ROM: Unrestricted ROM          Muscle Tone/Strength: Functionally intact. No obvious neuro-muscular anomalies detected.  Muscle Tone/Strength: Functionally intact. No obvious neuro-muscular anomalies detected.  Sensory (Neurological): Unimpaired          Sensory (Neurological): Unimpaired          Palpation: No palpable anomalies              Palpation: No palpable anomalies              Provocative Test(s):  Phalen's test: deferred Tinel's test: deferred Apley's  scratch test (touch opposite shoulder):  Action 1 (Across chest): deferred Action 2 (Overhead): deferred Action 3 (LB reach): deferred   Provocative Test(s):  Phalen's test: deferred Tinel's test: deferred Apley's scratch test (touch opposite shoulder):  Action 1 (Across chest): deferred Action 2 (Overhead): deferred Action 3 (LB reach): deferred    Thoracic Spine Area Exam  Skin & Axial Inspection: No masses, redness, or swelling Alignment: Symmetrical Functional ROM: Unrestricted ROM Stability: No instability detected Muscle Tone/Strength: Functionally intact. No obvious neuro-muscular anomalies detected. Sensory (Neurological): Unimpaired Muscle strength & Tone: No palpable anomalies  Lumbar Spine Area Exam  Skin & Axial Inspection: No masses, redness, or swelling Alignment: Symmetrical Functional ROM: Unrestricted ROM       Stability: No instability detected Muscle Tone/Strength: Functionally intact. No obvious neuro-muscular anomalies detected. Sensory (Neurological): Unimpaired Palpation: No palpable anomalies       Provocative Tests: Hyperextension/rotation test: deferred today       Lumbar quadrant test (Kemp's test): deferred today       Lateral bending test: deferred today       Patrick's Maneuver:  deferred today                   FABER test: deferred today                   S-I anterior distraction/compression test: deferred today         S-I lateral compression test: deferred today         S-I Thigh-thrust test: deferred today         S-I Gaenslen's test: deferred today          Gait & Posture Assessment  Ambulation: Unassisted Gait: Relatively normal for age and body habitus Posture: WNL   Lower Extremity Exam    Side: Right lower extremity  Side: Left lower extremity  Stability: No instability observed          Stability: No instability observed          Skin & Extremity Inspection: Skin color, temperature, and hair growth are WNL. No peripheral edema or cyanosis. No masses, redness, swelling, asymmetry, or associated skin lesions. No contractures.  Skin & Extremity Inspection: Skin color, temperature, and hair growth are WNL. No peripheral edema or cyanosis. No masses, redness, swelling, asymmetry, or associated skin lesions. No contractures.  Functional ROM: Unrestricted ROM                  Functional ROM: Decreased ROM for knee joint          Muscle Tone/Strength: Functionally intact. No obvious neuro-muscular anomalies detected.  Muscle Tone/Strength: Functionally intact. No obvious neuro-muscular anomalies detected.  Sensory (Neurological): Unimpaired  Sensory (Neurological): Arthropathic arthralgia  Palpation: Fluid-filled cyst behind knee, posterior.  Likely Baker's cyst that the patient has had drained in the past.   Palpation: Non-tender   Assessment  Primary Diagnosis & Pertinent Problem List: The primary encounter diagnosis was Primary osteoarthritis of right knee. Diagnoses of Chronic pain of right knee and Class 2 obesity with body mass index (BMI) of 35.0 to 35.9 in adult, unspecified obesity type, unspecified whether serious comorbidity present were also pertinent to this visit.  Status Diagnosis  Persistent Responding Persistent 1. Primary osteoarthritis of  right knee   2. Chronic pain of right knee   3. Class 2 obesity with body mass index (BMI) of 35.0 to 35.9 in adult, unspecified obesity type, unspecified whether serious comorbidity present  57 year old female with a history of right knee osteoarthritis status post right knee genicular nerve block who endorses approximately 75% pain relief for 2 weeks after the procedure with gradual return of her pain thereafter.  Patient states that this block was much more comfortable and better tolerated for her when compared to her block at [facility].  She endorses improvement and pain, range of motion, functional status after the block.  Patient does have a fluid-filled cyst in the posterior aspect of her knee which could be a Baker's cyst.  Patient has had this drained in the past.  I recommend the patient have this evaluated potentially drained before her next genicular nerve block #2.  Risks and benefits of second genicular nerve block discussed.  Patient would like to proceed.  Depending upon results with second genicular nerve block, we can discuss radiofrequency ablation of the right genicular nerves.  Plan of Care   Lab-work, procedure(s), and/or referral(s): Orders Placed This Encounter  Procedures  . GENICULAR NERVE BLOCK  #2 GNB  Provider-requested follow-up: Return in about 8 days (around 03/03/2018) for Procedure.   Time Note: Greater than 50% of the 25 minute(s) of face-to-face time spent with [name], was spent in counseling/coordination of care regarding: the appropriate use of the pain scale, Ms. Bumpus's primary cause of pain, the treatment plan, treatment alternatives, the risks and possible complications of proposed treatment, going over the informed consent, the results, interpretation and significance of  her recent diagnostic interventional treatment(s), realistic expectations and the goals of pain management (increased in functionality).  Future Appointments  Date Time  Provider Department Center  02/24/2018  4:00 PM [facility]    Primary Care Physician: Barbette ReichmannHande, Vishwanath, MD [facility] Note by: Edward JollyBilal Treson Laura, M.D Date: 02/23/2018; Time: 2:28 PM  Patient Instructions  Preparing for Procedure with Sedation Instructions: . Oral Intake: Do not eat or drink anything for at least 8 hours prior to your procedure. . Transportation: Public transportation is not allowed. Bring an adult driver. The driver must be physically present in our waiting room before any procedure can be started. Marland Kitchen. Physical Assistance: Bring an adult capable of physically assisting you, in the event you need help. . Blood Pressure Medicine: Take your blood pressure medicine with a sip of water the morning of the procedure. . Insulin: Take only  of your normal insulin dose. . Preventing infections: Shower with an antibacterial soap the morning of your procedure. . Build-up your immune system: Take 1000 mg of Vitamin C with every meal (3 times a day) the day prior to your procedure. . Pregnancy: If you are pregnant, call and cancel the procedure. . Sickness: If you have a cold, fever, or any active infections, call and cancel the procedure. . Arrival: You must be in the facility at least 30 minutes prior to your scheduled procedure. . Children: Do not bring children with you. . Dress appropriately: Bring dark clothing that you would not mind if they get stained. . Valuables: Do not bring any jewelry or valuables. Procedure appointments are reserved for interventional treatments only. Marland Kitchen. No Prescription Refills. . No medication changes will be discussed during procedure appointments. . No disability issues will be discussed.

## 2018-02-23 NOTE — Progress Notes (Signed)
Safety precautions to be maintained throughout the outpatient stay will include: orient to surroundings, keep bed in low position, maintain call bell within reach at all times, provide assistance with transfer out of bed and ambulation.  

## 2018-02-24 ENCOUNTER — Telehealth: Payer: Self-pay

## 2018-02-24 NOTE — Telephone Encounter (Signed)
I called the patient to schedule her genicular nerve block and she told me Dr. Pernell DupreAdams said he would work her in before December 1. Can you ask him, or tell me, when to schedule, he is booked solid for procedures. Thanks.

## 2018-02-24 NOTE — Telephone Encounter (Signed)
Whoops. Sorry, I thought she said Adams.

## 2018-02-24 NOTE — Telephone Encounter (Signed)
This is a  Lindsay Trevino patient. I don't think Dr. Pernell DupreAdams does geniculars anyway.

## 2018-03-02 ENCOUNTER — Telehealth: Payer: Self-pay

## 2018-03-02 NOTE — Telephone Encounter (Addendum)
Attempted to call patient.  Female who answered phone states she is at work. Called patient at work and she states that the Doctor did not want to drain Bakers cysts because they would fill back up.  She really wants to get block done.  Spoke with DR Cherylann RatelLateef and notified him and he said he would proceed with the block.  Patient notified.

## 2018-03-02 NOTE — Telephone Encounter (Signed)
Pt went to see Dr Gerarda Gunthertrubinski to get cyst drained and said that the dr said theres no need to drain them because they will fill back up. Pt wanted to know if she could still get her next block done on the 27th or not? Pt said to call her at work [phone]

## 2018-03-03 ENCOUNTER — Ambulatory Visit: Payer: 59 | Admitting: Student in an Organized Health Care Education/Training Program

## 2018-03-10 ENCOUNTER — Ambulatory Visit (HOSPITAL_BASED_OUTPATIENT_CLINIC_OR_DEPARTMENT_OTHER): Payer: 59 | Admitting: Student in an Organized Health Care Education/Training Program

## 2018-03-10 ENCOUNTER — Encounter: Payer: Self-pay | Admitting: Student in an Organized Health Care Education/Training Program

## 2018-03-10 ENCOUNTER — Other Ambulatory Visit: Payer: Self-pay

## 2018-03-10 ENCOUNTER — Ambulatory Visit
Admission: RE | Admit: 2018-03-10 | Discharge: 2018-03-10 | Disposition: A | Discharge status: Home / Self Care | Payer: 59 | Source: Ambulatory Visit | Attending: Student in an Organized Health Care Education/Training Program | Admitting: Student in an Organized Health Care Education/Training Program

## 2018-03-10 VITALS — BP 131/70 | HR 83 | Temp 97.4°F | Resp 15 | Ht 64.5 in | Wt 210.0 lb

## 2018-03-10 DIAGNOSIS — M1711 Unilateral primary osteoarthritis, right knee: Secondary | ICD-10-CM | POA: Diagnosis present

## 2018-03-10 DIAGNOSIS — M25561 Pain in right knee: Secondary | ICD-10-CM | POA: Diagnosis present

## 2018-03-10 DIAGNOSIS — G8929 Other chronic pain: Secondary | ICD-10-CM | POA: Insufficient documentation

## 2018-03-10 MED ORDER — ROPIVACAINE HCL 2 MG/ML IJ SOLN
INTRAMUSCULAR | Status: AC
  Filled 2018-03-10: qty 10

## 2018-03-10 MED ORDER — FENTANYL CITRATE (PF) 100 MCG/2ML IJ SOLN: 25.0000 ug | INTRAMUSCULAR | Status: DC | PRN

## 2018-03-10 MED ORDER — MIDAZOLAM HCL 5 MG/5ML IJ SOLN
1.0000 mg | INTRAMUSCULAR | Status: DC | PRN
  Administered 2018-03-10: 1 mg via INTRAVENOUS

## 2018-03-10 MED ORDER — LIDOCAINE HCL 2 % IJ SOLN
INTRAMUSCULAR | Status: AC
  Filled 2018-03-10: qty 20

## 2018-03-10 MED ORDER — DEXAMETHASONE SODIUM PHOSPHATE 10 MG/ML IJ SOLN
10.0000 mg | Freq: Once | INTRAMUSCULAR | Status: AC
  Administered 2018-03-10: 10 mg

## 2018-03-10 MED ORDER — LIDOCAINE HCL 2 % IJ SOLN
20.0000 mL | Freq: Once | INTRAMUSCULAR | Status: AC
  Administered 2018-03-10: 400 mg

## 2018-03-10 MED ORDER — MIDAZOLAM HCL 5 MG/5ML IJ SOLN
INTRAMUSCULAR | Status: AC
  Filled 2018-03-10: qty 5

## 2018-03-10 MED ORDER — ROPIVACAINE HCL 2 MG/ML IJ SOLN
10.0000 mL | Freq: Once | INTRAMUSCULAR | Status: AC
  Administered 2018-03-10: 9 mL

## 2018-03-10 MED ORDER — DEXAMETHASONE SODIUM PHOSPHATE 10 MG/ML IJ SOLN
INTRAMUSCULAR | Status: AC
  Filled 2018-03-10: qty 1

## 2018-03-10 MED ORDER — LACTATED RINGERS IV SOLN
1000.0000 mL | Freq: Once | INTRAVENOUS | Status: AC
  Administered 2018-03-10: 1000 mL via INTRAVENOUS

## 2018-03-10 MED ORDER — FENTANYL CITRATE (PF) 100 MCG/2ML IJ SOLN
INTRAMUSCULAR | Status: AC
  Filled 2018-03-10: qty 2

## 2018-03-10 NOTE — Patient Instructions (Signed)

## 2018-03-10 NOTE — Progress Notes (Signed)
Patient's Name: Lindsay Trevino  MRN: [id number]  Referring Provider: Barbette Reichmann, MD  DOB: August 04, 1960  PCP: Barbette Reichmann, MD  DOS: 03/10/2018  Note by: Edward Jolly, MD  Service setting: Ambulatory outpatient  Specialty: Interventional Pain Management  Patient type: Established  Location: [facility] (AMB) Pain Management Facility  Visit type: Interventional Procedure   Primary Reason for Visit: Interventional Pain Management Treatment. CC: Knee Pain (right)  Procedure:          Anesthesia, Analgesia, Anxiolysis:  Type: Genicular Nerves Block (Superior-lateral, Superior-medial, and Inferior-medial Genicular Nerves) #2  CPT: 64450      Primary Purpose: Diagnostic Region: Lateral, Anterior, and Medial aspects of the knee joint, above and below the knee joint proper. Level: Superior and inferior to the knee joint. Target Area: For Genicular Nerve block(s), the targets are: the superior-lateral genicular nerve, located in the lateral distal portion of the femoral shaft as it curves to form the lateral epicondyle, in the region of the distal femoral metaphysis; the superior-medial genicular nerve, located in the medial distal portion of the femoral shaft as it curves to form the medial epicondyle; and the inferior-medial genicular nerve, located in the medial, proximal portion of the tibial shaft, as it curves to form the medial epicondyle, in the region of the proximal tibial metaphysis. Approach: Anterior, percutaneous, ipsilateral approach. Laterality: Right knee  Type: Moderate (Conscious) Sedation combined with Local Anesthesia Indication(s): Analgesia and Anxiety Route: Intravenous (IV) IV Access: Secured Sedation: Meaningful verbal contact was maintained at all times during the procedure  Local Anesthetic: Lidocaine 1-2%  Position: Modified Fowler's position with pillows under the targeted knee(s).   Indications: 1. Primary osteoarthritis of right knee   2. Chronic pain of right knee     Pain Score: Pre-procedure: 9 /10 Post-procedure: 0-No pain/10  Pre-op Assessment:  Lindsay Trevino is a 57 y.o. (year old), female patient, seen today for interventional treatment. She  has a past surgical history that includes Cesarean section; Abdominal hysterectomy; and Tonsillectomy. Lindsay Trevino has a current medication list which includes the following prescription(s): alprazolam, atorvastatin, butalbital-acetaminophen-caffeine, cyclobenzaprine, diclofenac sodium, estradiol, meloxicam, metoprolol succinate, and venlafaxine, and the following Facility-Administered Medications: lactated ringers and midazolam. Her primarily concern today is the Knee Pain (right)  Initial Vital Signs:  Pulse/HCG Rate: 83ECG Heart Rate: 74 Temp: 97.9 F (36.6 C) Resp: 18 BP: 130/90 SpO2: 99 %  BMI: Estimated body mass index is 35.49 kg/m as calculated from the following:   Height as of this encounter: 5' 4.5" (1.638 m).   Weight as of this encounter: 210 lb (95.3 kg).  Risk Assessment: Allergies: Reviewed. She is allergic to morphine and related and penicillins.  Allergy Precautions: None required Coagulopathies: Reviewed. None identified.  Blood-thinner therapy: None at this time Active Infection(s): Reviewed. None identified. Lindsay Trevino is afebrile  Site Confirmation: Lindsay Trevino was asked to confirm the procedure and laterality before marking the site Procedure checklist: Completed Consent: Before the procedure and under the influence of no sedative(s), amnesic(s), or anxiolytics, the patient was informed of the treatment options, risks and possible complications. To fulfill our ethical and legal obligations, as recommended by the American Medical Association's Code of Ethics, I have informed the patient of my clinical impression; the nature and purpose of the treatment or procedure; the risks, benefits, and possible complications of the intervention; the alternatives, including doing nothing;  the risk(s) and benefit(s) of the alternative treatment(s) or procedure(s); and the risk(s) and benefit(s) of doing nothing. The patient was provided  information about the general risks and possible complications associated with the procedure. These may include, but are not limited to: failure to achieve desired goals, infection, bleeding, organ or nerve damage, allergic reactions, paralysis, and death. In addition, the patient was informed of those risks and complications associated to the procedure, such as failure to decrease pain; infection; bleeding; organ or nerve damage with subsequent damage to sensory, motor, and/or autonomic systems, resulting in permanent pain, numbness, and/or weakness of one or several areas of the body; allergic reactions; (i.e.: anaphylactic reaction); and/or death. Furthermore, the patient was informed of those risks and complications associated with the medications. These include, but are not limited to: allergic reactions (i.e.: anaphylactic or anaphylactoid reaction(s)); adrenal axis suppression; blood sugar elevation that in diabetics may result in ketoacidosis or comma; water retention that in patients with history of congestive heart failure may result in shortness of breath, pulmonary edema, and decompensation with resultant heart failure; weight gain; swelling or edema; medication-induced neural toxicity; particulate matter embolism and blood vessel occlusion with resultant organ, and/or nervous system infarction; and/or aseptic necrosis of one or more joints. Finally, the patient was informed that Medicine is not an exact science; therefore, there is also the possibility of unforeseen or unpredictable risks and/or possible complications that may result in a catastrophic outcome. The patient indicated having understood very clearly. We have given the patient no guarantees and we have made no promises. Enough time was given to the patient to ask questions, all of which  were answered to the patient's satisfaction. Lindsay Trevino has indicated that she wanted to continue with the procedure. Attestation: I, the ordering provider, attest that I have discussed with the patient the benefits, risks, side-effects, alternatives, likelihood of achieving goals, and potential problems during recovery for the procedure that I have provided informed consent. Date  Time:   Pre-Procedure Preparation:  Monitoring: As per clinic protocol. Respiration, ETCO2, SpO2, BP, heart rate and rhythm monitor placed and checked for adequate function Safety Precautions: Patient was assessed for positional comfort and pressure points before starting the procedure. Time-out: I initiated and conducted the "Time-out" before starting the procedure, as per protocol. The patient was asked to participate by confirming the accuracy of the "Time Out" information. Verification of the correct person, site, and procedure were performed and confirmed by me, the nursing staff, and the patient. "Time-out" conducted as per Joint Commission's Universal Protocol (UP.01.01.01). Time: 0949  Description of Procedure:          Area Prepped: Entire knee area, from mid-thigh to mid-shin, lateral, anterior, and medial aspects. Prepping solution: ChloraPrep (2% chlorhexidine gluconate and 70% isopropyl alcohol) Safety Precautions: Aspiration looking for blood return was conducted prior to all injections. At no point did we inject any substances, as a needle was being advanced. No attempts were made at seeking any paresthesias. Safe injection practices and needle disposal techniques used. Medications properly checked for expiration dates. SDV (single dose vial) medications used. Description of the Procedure: Protocol guidelines were followed. The patient was placed in position over the procedure table. The target area was identified and the area prepped in the usual manner. Skin & deeper tissues infiltrated with local  anesthetic. Appropriate amount of time allowed to pass for local anesthetics to take effect. The procedure needles were then advanced to the target area. Proper needle placement secured. Negative aspiration confirmed. Solution injected in intermittent fashion, asking for systemic symptoms every 0.5cc of injectate. The needles were then removed and the area cleansed,  making sure to leave some of the prepping solution back to take advantage of its long term bactericidal properties.  Vitals:   03/10/18 0956 03/10/18 1005 03/10/18 1015 03/10/18 1025  BP: (!) 117/99 132/86 133/75 131/70  Pulse:      Resp: 14 17 16 15   Temp:  (!) 97.2 F (36.2 C)  (!) 97.4 F (36.3 C)  TempSrc:      SpO2: 92% 100% 100% 98%  Weight:      Height:        Start Time: 0949 hrs. End Time: 0956 hrs. Materials:  Needle(s) Type: Spinal Needle Gauge: 22G Length: 3.5-in Medication(s): Please see orders for medications and dosing details. 6 cc solution made of 5 cc of 0.2% ropivacaine, 1 cc of Decadron 10 mg/cc.  2 cc injected at each level above. Imaging Guidance (Non-Spinal):          Type of Imaging Technique: Fluoroscopy Guidance (Non-Spinal) Indication(s): Assistance in needle guidance and placement for procedures requiring needle placement in or near specific anatomical locations not easily accessible without such assistance. Exposure Time: Please see nurses notes. Contrast: Before injecting any contrast, we confirmed that the patient did not have an allergy to iodine, shellfish, or radiological contrast. Once satisfactory needle placement was completed at the desired level, radiological contrast was injected. Contrast injected under live fluoroscopy. No contrast complications. See chart for type and volume of contrast used. Fluoroscopic Guidance: I was personally present during the use of fluoroscopy. "Tunnel Vision Technique" used to obtain the best possible view of the target area. Parallax error corrected before  commencing the procedure. "Direction-depth-direction" technique used to introduce the needle under continuous pulsed fluoroscopy. Once target was reached, antero-posterior, oblique, and lateral fluoroscopic projection used confirm needle placement in all planes. Images permanently stored in EMR. Interpretation: I personally interpreted the imaging intraoperatively. Adequate needle placement confirmed in multiple planes. Appropriate spread of contrast into desired area was observed. No evidence of afferent or efferent intravascular uptake. Permanent images saved into the patient's record.  Antibiotic Prophylaxis:   Anti-infectives (From admission, onward)   None     Indication(s): None identified  Post-operative Assessment:  Post-procedure Vital Signs:  Pulse/HCG Rate: 8369 Temp: (!) 97.4 F (36.3 C) Resp: 15 BP: 131/70 SpO2: 98 %  EBL: None  Complications: No immediate post-treatment complications observed by team, or reported by patient.  Note: The patient tolerated the entire procedure well. A repeat set of vitals were taken after the procedure and the patient was kept under observation following institutional policy, for this type of procedure. Post-procedural neurological assessment was performed, showing return to baseline, prior to discharge. The patient was provided with post-procedure discharge instructions, including a section on how to identify potential problems. Should any problems arise concerning this procedure, the patient was given instructions to immediately contact us, at any time, without hesitation. In any case, we plan to contact the patient by telephone for a follow-up status report regarding this interventional procedure.  Comments:  No additional relevant information. 5 out of 5 strength bilateral lower extremity: Plantar flexion, dorsiflexion, knee flexion, knee extension.  Plan of Care    Imaging Orders     DG C-Arm 1-60 Min-No Report Procedure Orders     No procedure(s) ordered today    Medications ordered for procedure: Meds ordered this encounter  Medications  . lactated ringers infusion 1,000 mL  . DISCONTD: fentaNYL (SUBLIMAZE) injection 25-100 mcg    Make sure Narcan is available in the pyxis when  using this medication. In the event of respiratory depression (RR< 8/min): Titrate NARCAN (naloxone) in increments of 0.1 to 0.2 mg IV at 2-3 minute intervals, until desired degree of reversal.  . ropivacaine (PF) 2 mg/mL (0.2%) (NAROPIN) injection 10 mL  . lidocaine (XYLOCAINE) 2 % (with pres) injection 400 mg  . dexamethasone (DECADRON) injection 10 mg  . midazolam (VERSED) 5 MG/5ML injection 1-2 mg    Make sure Flumazenil is available in the pyxis when using this medication. If oversedation occurs, administer 0.2 mg IV over 15 sec. If after 45 sec no response, administer 0.2 mg again over 1 min; may repeat at 1 min intervals; not to exceed 4 doses (1 mg)   Medications administered: We administered lactated ringers, ropivacaine (PF) 2 mg/mL (0.2%), lidocaine, dexamethasone, and midazolam.  See the medical record for exact dosing, route, and time of administration.  Disposition: Discharge home  Discharge Date & Time: 03/10/2018; 1025 hrs.   Physician-requested Follow-up: Return in about 3 weeks (around 03/31/2018) for Post Procedure Evaluation.  Future Appointments  Date Time Provider Department Center  03/29/2018  2:00 PM Edward Jolly, MD [facility] None   Primary Care Physician: Barbette Reichmann, MD Location: [facility] Facility Note by: Edward Jolly, MD Date: 03/10/2018; Time: 10:47 AM  Disclaimer:  Medicine is not an exact science. The only guarantee in medicine is that nothing is guaranteed. It is important to note that the decision to proceed with this intervention was based on the information collected from the patient. The Data and conclusions were drawn from the patient's questionnaire, the interview,  and the physical examination. Because the information was provided in large part by the patient, it cannot be guaranteed that it has not been purposely or unconsciously manipulated. Every effort has been made to obtain as much relevant data as possible for this evaluation. It is important to note that the conclusions that lead to this procedure are derived in large part from the available data. Always take into account that the treatment will also be dependent on availability of resources and existing treatment guidelines, considered by other Pain Management Practitioners as being common knowledge and practice, at the time of the intervention. For Medico-Legal purposes, it is also important to point out that variation in procedural techniques and pharmacological choices are the acceptable norm. The indications, contraindications, technique, and results of the above procedure should only be interpreted and judged by a Board-Certified Interventional Pain Specialist with extensive familiarity and expertise in the same exact procedure and technique.

## 2018-03-10 NOTE — Progress Notes (Signed)
Safety precautions to be maintained throughout the outpatient stay will include: orient to surroundings, keep bed in low position, maintain call bell within reach at all times, provide assistance with transfer out of bed and ambulation.  

## 2018-03-16 ENCOUNTER — Telehealth: Payer: Self-pay | Admitting: Student in an Organized Health Care Education/Training Program

## 2018-03-16 NOTE — Telephone Encounter (Signed)
Has follow-up appointment on 03-29-18. She is asking if there is are other options that could be done before that appt.?

## 2018-03-16 NOTE — Telephone Encounter (Signed)
Pt called and said that her knee is still hurting and the nerve block only lasted a few days and wanted to know if there was anything else she could do.

## 2018-03-16 NOTE — Telephone Encounter (Signed)
Called pts home and husband answered phone. States [facility] is [facility] [employer]  Not sure of the number and unable to tell me the dept. Told husband that she need to wait 3-10 for the steroid to start working. Will try to call at work.

## 2018-03-17 MED ORDER — DICLOFENAC SODIUM 75 MG PO TBEC: 75.0000 mg | DELAYED_RELEASE_TABLET | Freq: Two times a day (BID) | ORAL | 0 refills | Status: DC

## 2018-03-17 NOTE — Telephone Encounter (Signed)
We'll discuss RFA at next visit- in the meantime I have called in Diclofenac. Instruct patient to refrain from all NSAIDs while on Diclofenac including Mobic.  Requested Prescriptions   Signed Prescriptions Disp Refills  . diclofenac (VOLTAREN) 75 MG EC tablet 28 tablet 0    Sig: Take 1 tablet (75 mg total) by mouth 2 (two) times daily for 14 days.    Authorizing Provider: Edward JollyLATEEF, Riccardo Holeman

## 2018-03-17 NOTE — Telephone Encounter (Signed)
Patient notified per voicemail that Diclofenac has been sent to pharmacy.

## 2018-03-22 ENCOUNTER — Ambulatory Visit: Payer: 59 | Admitting: Anesthesiology

## 2018-03-29 ENCOUNTER — Encounter: Payer: Self-pay | Admitting: Student in an Organized Health Care Education/Training Program

## 2018-03-29 ENCOUNTER — Ambulatory Visit
Payer: Managed Care, Other (non HMO) | Attending: Student in an Organized Health Care Education/Training Program | Admitting: Student in an Organized Health Care Education/Training Program

## 2018-03-29 ENCOUNTER — Other Ambulatory Visit: Payer: Self-pay

## 2018-03-29 VITALS — BP 147/87 | HR 84 | Temp 98.1°F | Resp 18 | Ht 64.5 in | Wt 220.0 lb

## 2018-03-29 DIAGNOSIS — Z79899 Other long term (current) drug therapy: Secondary | ICD-10-CM | POA: Diagnosis not present

## 2018-03-29 DIAGNOSIS — E785 Hyperlipidemia, unspecified: Secondary | ICD-10-CM | POA: Insufficient documentation

## 2018-03-29 DIAGNOSIS — E669 Obesity, unspecified: Secondary | ICD-10-CM

## 2018-03-29 DIAGNOSIS — Z87891 Personal history of nicotine dependence: Secondary | ICD-10-CM | POA: Insufficient documentation

## 2018-03-29 DIAGNOSIS — M1711 Unilateral primary osteoarthritis, right knee: Secondary | ICD-10-CM | POA: Diagnosis not present

## 2018-03-29 DIAGNOSIS — Z885 Allergy status to narcotic agent status: Secondary | ICD-10-CM | POA: Diagnosis not present

## 2018-03-29 DIAGNOSIS — I10 Essential (primary) hypertension: Secondary | ICD-10-CM | POA: Insufficient documentation

## 2018-03-29 DIAGNOSIS — M25561 Pain in right knee: Secondary | ICD-10-CM | POA: Diagnosis not present

## 2018-03-29 DIAGNOSIS — Z880 Allergy status to penicillin: Secondary | ICD-10-CM | POA: Diagnosis not present

## 2018-03-29 DIAGNOSIS — Z6835 Body mass index (BMI) 35.0-35.9, adult: Secondary | ICD-10-CM | POA: Diagnosis not present

## 2018-03-29 DIAGNOSIS — G8929 Other chronic pain: Secondary | ICD-10-CM | POA: Diagnosis not present

## 2018-03-29 DIAGNOSIS — G894 Chronic pain syndrome: Secondary | ICD-10-CM | POA: Insufficient documentation

## 2018-03-29 MED ORDER — DICLOFENAC SODIUM 75 MG PO TBEC: 75.0000 mg | DELAYED_RELEASE_TABLET | Freq: Two times a day (BID) | ORAL | 2 refills | Status: DC

## 2018-03-29 NOTE — Progress Notes (Signed)
Patient's Name: Lindsay Trevino  MRN: [id number]  Referring Provider: Barbette Reichmann, MD  DOB: 1961-01-29  PCP: Barbette Reichmann, MD  DOS: 03/29/2018  Note by: Edward Jolly, MD  Service setting: Ambulatory outpatient  Specialty: Interventional Pain Management  Location: [facility] (AMB) Pain Management Facility    Patient type: Established   Primary Reason(s) for Visit: Encounter for post-procedure evaluation of chronic illness with mild to moderate exacerbation CC: Knee Pain (right)  HPI  Lindsay Trevino is a 57 y.o. year old, female patient, who comes today for a post-procedure evaluation. She has Primary osteoarthritis of right knee; Chronic pain of right knee; Class 2 obesity with body mass index (BMI) of 35.0 to 35.9 in adult; and Chronic pain syndrome on their problem list. Her primarily concern today is the Knee Pain (right)  Pain Assessment: Location: Right Knee Radiating: denies Onset: More than a month ago Duration: Chronic pain Quality: Aching, Burning, Constant, Shooting, Sharp(however not as intense as before procedure) Severity: 4 /10 (subjective, self-reported pain score)  Note: Reported level is compatible with observation.                         When using our objective Pain Scale, levels between 6 and 10/10 are said to belong in an emergency room, as it progressively worsens from a 6/10, described as severely limiting, requiring emergency care not usually available at an outpatient pain management facility. At a 6/10 level, communication becomes difficult and requires great effort. Assistance to reach the emergency department may be required. Facial flushing and profuse sweating along with potentially dangerous increases in heart rate and blood pressure will be evident. Effect on ADL: limits daily activity Timing: Constant Modifying factors: procedure BP: (!) 147/87  HR: 84  Lindsay Trevino comes in today for post-procedure evaluation.  Further details on both, my assessment(s),  as well as the proposed treatment plan, please see below.  Post-Procedure Assessment  03/16/2018 Procedure: Right knee genicular nerve block Pre-procedure pain score:  9/10 Post-procedure pain score: 0/10         Influential Factors: BMI: 37.18 kg/m Intra-procedural challenges: None observed.         Assessment challenges: None detected.              Reported side-effects: None.        Post-procedural adverse reactions or complications: None reported         Sedation: Please see nurses note. When no sedatives are used, the analgesic levels obtained are directly associated to the effectiveness of the local anesthetics. However, when sedation is provided, the level of analgesia obtained during the initial 1 hour following the intervention, is believed to be the result of a combination of factors. These factors may include, but are not limited to: 1. The effectiveness of the local anesthetics used. 2. The effects of the analgesic(s) and/or anxiolytic(s) used. 3. The degree of discomfort experienced by the patient at the time of the procedure. 4. The patients ability and reliability in recalling and recording the events. 5. The presence and influence of possible secondary gains and/or psychosocial factors. Reported result: Relief experienced during the 1st hour after the procedure: 100 % (Ultra-Short Term Relief)            Interpretative annotation: Clinically appropriate result. Analgesia during this period is likely to be Local Anesthetic and/or IV Sedative (Analgesic/Anxiolytic) related.          Effects of local anesthetic: The analgesic effects attained  during this period are directly associated to the localized infiltration of local anesthetics and therefore cary significant diagnostic value as to the etiological location, or anatomical origin, of the pain. Expected duration of relief is directly dependent on the pharmacodynamics of the local anesthetic used. Long-acting (4-6 hours)  anesthetics used.  Reported result: Relief during the next 4 to 6 hour after the procedure: 100 % (Short-Term Relief)            Interpretative annotation: Clinically appropriate result. Analgesia during this period is likely to be Local Anesthetic-related.          Long-term benefit: Defined as the period of time past the expected duration of local anesthetics (1 hour for short-acting and 4-6 hours for long-acting). With the possible exception of prolonged sympathetic blockade from the local anesthetics, benefits during this period are typically attributed to, or associated with, other factors such as analgesic sensory neuropraxia, antiinflammatory effects, or beneficial biochemical changes provided by agents other than the local anesthetics.  Reported result: Extended relief following procedure: 100 %(relief from procedure lasted 2 days, then pain was as before procedure; started diclofenac which keeps pain score  at 4. ) (Long-Term Relief)            Interpretative annotation: Clinically possible results. Good relief. No permanent benefit expected. Inflammation plays a part in the etiology to the pain.          Current benefits: Defined as reported results that persistent at this point in time.   Analgesia: 25-50 %            Function: Somewhat improved ROM: Somewhat improved Interpretative annotation: Recurrence of symptoms. No permanent benefit expected. Effective diagnostic intervention.          Interpretation: Results would suggest a successful diagnostic intervention.                  Plan:  Please see "Plan of Care" for details.                Meds   Current Outpatient Medications:  .  ALPRAZolam (XANAX) 0.25 MG tablet, Take 0.25 mg by mouth as needed for anxiety., Disp: , Rfl:  .  atorvastatin (LIPITOR) 20 MG tablet, Take 20 mg by mouth daily., Disp: , Rfl:  .  butalbital-acetaminophen-caffeine (FIORICET, ESGIC) 50-325-40 MG tablet, Take 1 tablet by mouth 2 (two) times daily as  needed for headache., Disp: , Rfl:  .  cyclobenzaprine (FLEXERIL) 5 MG tablet, Take 5 mg by mouth at bedtime., Disp: , Rfl:  .  diclofenac (VOLTAREN) 75 MG EC tablet, Take 1 tablet (75 mg total) by mouth 2 (two) times daily., Disp: 60 tablet, Rfl: 2 .  diclofenac sodium (VOLTAREN) 1 % GEL, Apply 2 g topically 4 (four) times daily., Disp: , Rfl:  .  estradiol (ESTRACE) 1 MG tablet, Take 1 mg by mouth daily., Disp: , Rfl:  .  metoprolol succinate (TOPROL-XL) 12.5 mg TB24 24 hr tablet, Take 12.5 mg by mouth daily., Disp: , Rfl:  .  venlafaxine (EFFEXOR) 75 MG tablet, Take 75 mg by mouth daily., Disp: , Rfl:   ROS  Constitutional: Denies any fever or chills Gastrointestinal: No reported hemesis, hematochezia, vomiting, or acute GI distress Musculoskeletal: Denies any acute onset joint swelling, redness, loss of ROM, or weakness Neurological: No reported episodes of acute onset apraxia, aphasia, dysarthria, agnosia, amnesia, paralysis, loss of coordination, or loss of consciousness  Allergies  [name] is allergic to morphine and  related and penicillins.  PFSH  Drug: Ms. Sweeting  reports no history of drug use. Alcohol:  reports no history of alcohol use. Tobacco:  reports that she has quit smoking. She has never used smokeless tobacco. Medical:  has a past medical history of Hyperlipidemia and Hypertension. Surgical: Ms. Greenstein  has a past surgical history that includes Cesarean section; Abdominal hysterectomy; and Tonsillectomy. Family: family history includes Breast cancer in her cousin.  Constitutional Exam  General appearance: Well nourished, well developed, and well hydrated. In no apparent acute distress Vitals:   03/29/18 1403  BP: (!) 147/87  Pulse: 84  Resp: 18  Temp: 98.1 F (36.7 C)  TempSrc: Oral  SpO2: 96%  Weight: 220 lb (99.8 kg)  Height: 5' 4.5" (1.638 m)   BMI Assessment: Estimated body mass index is 37.18 kg/m as calculated from the following:   Height as  of this encounter: 5' 4.5" (1.638 m).   Weight as of this encounter: 220 lb (99.8 kg).  BMI interpretation table: BMI level Category Range association with higher incidence of chronic pain  <18 kg/m2 Underweight   18.5-24.9 kg/m2 Ideal body weight   25-29.9 kg/m2 Overweight Increased incidence by 20%  30-34.9 kg/m2 Obese (Class I) Increased incidence by 68%  35-39.9 kg/m2 Severe obesity (Class II) Increased incidence by 136%  >40 kg/m2 Extreme obesity (Class III) Increased incidence by 254%   Patient's current BMI Ideal Body weight  Body mass index is 37.18 kg/m. Ideal body weight: 55.8 kg (123 lb 2 oz) Adjusted ideal body weight: 73.4 kg (161 lb 14 oz)   BMI Readings from Last 4 Encounters:  03/29/18 37.18 kg/m  03/10/18 35.49 kg/m  02/23/18 34.33 kg/m  02/03/18 36.33 kg/m   Wt Readings from Last 4 Encounters:  03/29/18 220 lb (99.8 kg)  03/10/18 210 lb (95.3 kg)  02/23/18 200 lb (90.7 kg)  02/03/18 215 lb (97.5 kg)  Psych/Mental status: Alert, oriented x 3 (person, place, & time)       Eyes: PERLA Respiratory: No evidence of acute respiratory distress  Cervical Spine Area Exam  Skin & Axial Inspection: No masses, redness, edema, swelling, or associated skin lesions Alignment: Symmetrical Functional ROM: Unrestricted ROM      Stability: No instability detected Muscle Tone/Strength: Functionally intact. No obvious neuro-muscular anomalies detected. Sensory (Neurological): Unimpaired Palpation: No palpable anomalies              Upper Extremity (UE) Exam    Side: Right upper extremity  Side: Left upper extremity  Skin & Extremity Inspection: Skin color, temperature, and hair growth are WNL. No peripheral edema or cyanosis. No masses, redness, swelling, asymmetry, or associated skin lesions. No contractures.  Skin & Extremity Inspection: Skin color, temperature, and hair growth are WNL. No peripheral edema or cyanosis. No masses, redness, swelling, asymmetry, or  associated skin lesions. No contractures.  Functional ROM: Unrestricted ROM          Functional ROM: Unrestricted ROM          Muscle Tone/Strength: Functionally intact. No obvious neuro-muscular anomalies detected.  Muscle Tone/Strength: Functionally intact. No obvious neuro-muscular anomalies detected.  Sensory (Neurological): Unimpaired          Sensory (Neurological): Unimpaired          Palpation: No palpable anomalies              Palpation: No palpable anomalies              Provocative Test(s):  Phalen's test: deferred Tinel's test: deferred Apley's scratch test (touch opposite shoulder):  Action 1 (Across chest): deferred Action 2 (Overhead): deferred Action 3 (LB reach): deferred   Provocative Test(s):  Phalen's test: deferred Tinel's test: deferred Apley's scratch test (touch opposite shoulder):  Action 1 (Across chest): deferred Action 2 (Overhead): deferred Action 3 (LB reach): deferred    Thoracic Spine Area Exam  Skin & Axial Inspection: No masses, redness, or swelling Alignment: Symmetrical Functional ROM: Unrestricted ROM Stability: No instability detected Muscle Tone/Strength: Functionally intact. No obvious neuro-muscular anomalies detected. Sensory (Neurological): Unimpaired Muscle strength & Tone: No palpable anomalies  Lumbar Spine Area Exam  Skin & Axial Inspection: No masses, redness, or swelling Alignment: Symmetrical Functional ROM: Unrestricted ROM       Stability: No instability detected Muscle Tone/Strength: Functionally intact. No obvious neuro-muscular anomalies detected. Sensory (Neurological): Unimpaired Palpation: No palpable anomalies       Provocative Tests: Hyperextension/rotation test: deferred today       Lumbar quadrant test (Kemp's test): deferred today       Lateral bending test: deferred today       Patrick's Maneuver: deferred today                   FABER* test: deferred today                   S-I anterior  distraction/compression test: deferred today         S-I lateral compression test: deferred today         S-I Thigh-thrust test: deferred today         S-I Gaenslen's test: deferred today         *(Flexion, ABduction and External Rotation)  Gait & Posture Assessment  Ambulation: Unassisted Gait: Relatively normal for age and body habitus Posture: WNL   Lower Extremity Exam    Side: Right lower extremity  Side: Left lower extremity  Stability: No instability observed          Stability: No instability observed          Skin & Extremity Inspection: Skin color, temperature, and hair growth are WNL. No peripheral edema or cyanosis. No masses, redness, swelling, asymmetry, or associated skin lesions. No contractures.  Skin & Extremity Inspection: Skin color, temperature, and hair growth are WNL. No peripheral edema or cyanosis. No masses, redness, swelling, asymmetry, or associated skin lesions. No contractures.  Functional ROM: Pain restricted ROM for knee joint          Functional ROM: Unrestricted ROM                  Muscle Tone/Strength: Functionally intact. No obvious neuro-muscular anomalies detected.  Muscle Tone/Strength: Functionally intact. No obvious neuro-muscular anomalies detected.  Sensory (Neurological): Arthropathic arthralgia        Sensory (Neurological): Unimpaired        DTR: Patellar: deferred today Achilles: deferred today Plantar: deferred today  DTR: Patellar: deferred today Achilles: deferred today Plantar: deferred today  Palpation: Complains of area being tender to palpation  Palpation: No palpable anomalies   Assessment  Primary Diagnosis & Pertinent Problem List: The primary encounter diagnosis was Primary osteoarthritis of right knee. Diagnoses of Chronic pain of right knee and Class 2 obesity with body mass index (BMI) of 35.0 to 35.9 in adult, unspecified obesity type, unspecified whether serious comorbidity present were also pertinent to this  visit.  Status Diagnosis  Responding Responding Persistent 1. Primary osteoarthritis of  right knee   2. Chronic pain of right knee   3. Class 2 obesity with body mass index (BMI) of 35.0 to 35.9 in adult, unspecified obesity type, unspecified whether serious comorbidity present      57 year old female follows up status post right knee genicular nerve block #2 who endorses 100% pain relief for the first 2 to 3 days after the block.  Patient states that at the time of her second knee block, she had increased swelling of her knee and inflammation.  She feels that her second diagnostic genicular nerve block may have been impacted by peripatellar swelling which is certainly possible.  Patient did have better relief after her first diagnostic nerve block when the swelling in her right knee was much less.  Today patient's swelling has improved since her genicular nerve block with me.  She states that she has been elevating her knee, has been applying a brace and has been icing it.  We had an extensive discussion about risks and benefits of genicular nerve RFA.  Patient would like to proceed.  She is also noticing benefit with oral diclofenac.  I will refill this as below.  Instructed patient to take 1 to 2 tablets daily as needed knee pain.  Patient endorsed understanding.  Plan: -Follow-up for right knee genicular RFA -Refill diclofenac as below.  Plan of Care  Pharmacotherapy (Medications Ordered): Meds ordered this encounter  Medications  . diclofenac (VOLTAREN) 75 MG EC tablet    Sig: Take 1 tablet (75 mg total) by mouth 2 (two) times daily.    Dispense:  60 tablet    Refill:  2   Lab-work, procedure(s), and/or referral(s): Orders Placed This Encounter  Procedures  . Radiofrequency,Genicular   Time Note: Greater than 50% of the 25 minute(s) of face-to-face time spent with Ms. Murin, was spent in counseling/coordination of care regarding: Ms. Dewing primary cause of pain, the  treatment plan, treatment alternatives, the risks and possible complications of proposed treatment, going over the informed consent, the results, interpretation and significance of  her recent diagnostic interventional treatment(s), realistic expectations, the goals of pain management (increased in functionality) and the need to bring and keep the BMI below 30.  Provider-requested follow-up: Return in about 5 weeks (around 05/03/2018) for Procedure.  No future appointments.  Primary Care Physician: Barbette Reichmann, MD Location: [facility] Note by: Edward Jolly, M.D Date: 03/29/2018; Time: 3:11 PM  Patient Instructions  ____________________________________________________________________________________________  Genicular Nerve Block  What is a genicular nerve block? A genicular nerve block is the injection of a local anesthetic to block the nerves that transmits pain from the knee.  What is the purpose of a facet nerve block? A genicular nerve block is a diagnostic procedure to determine if the pathologic changes (i.e. arthritis, meniscal tears, etc) and inflammation within the knee joint is the source of your knee pain. It also confirms that the knee pain will respond well to the actual treatment procedure. If a genicular nerve block works, it will give you relief for several hours. After that, the pain is expected to return to normal. This test is always performed twice (usually a week or two apart) because two successful tests are required to move onto treatment. If both diagnostic tests are positive, then we schedule a treatment called radiofrequency (RF) ablation. In this procedure, the same nerves are cauterized, which typically leads to pain relief for 4 -18 months. If this process works well for one knee, it  can be performed on the other knee if needed.  How is the procedure performed? You will be placed on the procedure table. The injection site is  sterilized with either iodine or chlorhexadine. The site to be injected is numbed with a local anesthetic, and a needle is directed to the target area. X-ray guidance is used to ensure proper placement and positioning of the needle. When the needle is properly positioned near the genicular nerve, local anesthetic is injected to numb that nerve. This will be repeated at multiple sites around the knee to block all genicular nerves.  Will the procedure be painful? The injection can be painful and we therefore provide the option of receiving IV sedation. IV sedation, combined with local anesthetic, can make the injection nearly pain free. It allows you to remain very still during the procedure, which can also make the injection easier, faster, and more successful. If you decide to have IV sedation, you must have a driver to get you home safely afterwards. In addition, you cannot have anything to eat or drink within 8 hours of your appointment (clear liquids are allowed until 3 hours before the procedure). If you take medications for diabetes, these medications may need to be adjusted the morning of the procedure. Your primary care physician can help you with this adjustment.  What are the discharge instructions? If you received IV sedation do not drive or operate machinery for at least 24 hours after the procedure. You may return to work the next day following your procedure. You may resume your normal diet immediately. Do not engage in any strenuous activity for 24 hours. You should, however, engage in moderate activity that typically causes your ususal pain. If the block works, those activities should not be painful for several hours after the injection. Do not take a bath, swim, or use a hot tub for 24 hours (you may take a shower). Call the office if you have any of the following: severe pain afterwards (different than your usual symptoms), redness/swelling/discharge at the injection site(s), fevers/chills,  difficulty with bowel or bladder functions.  What are the risks and side effects? The complication rate for this procedure is very low. Whenever a needle enters the skin, bleeding or infection can occur. Some other serious but extremely rare risks include paralysis and death. You may have an allergic reaction to any of the medications used. If you have a known allergy to any medications, especially local anesthetics, notify our staff before the procedure takes place. You may experience any of the following side effects up to 4 - 6 hours after the procedure: . Leg muscle weakness or numbness may occur due to the local anesthetic affecting the nerves that control your legs (this is a temporary affect and it is not paralysis). If you have any leg weakness or numbness, walk only with assistance in order to prevent falls and injury. Your leg strength will return slowly and completely. . Dizziness may occur due to a decrease in your blood pressure. If this occurs, remain in a seated or lying position. Gradually sit up, and then stand after at least 10 minutes of sitting. . Mild headaches may occur. Drink fluids and take pain medications if needed. If the headaches persist or become severe, call the office. . Mild discomfort at the injection site can occur. This typically lasts for a few hours but can persist for a couple days. If this occurs, take anti-inflammatories or pain medications, apply ice to the area the  day of the procedure. If it persists, apply moist heat in the day(s) following.  The side effects listed above can be normal. They are not dangerous and will resolve on their own. If, however, you experience any of the following, a complication may have occurred and you should either contact your doctor. If he is not readily available, then you should proceed to the closest urgent care center for evaluation: . Severe or progressive pain at the injection site(s) . Arm or leg weakness that  progressively worsens or persists for longer than 8 hours . Severe or progressive redness, swelling, or discharge from the injections site(s) . Fevers, chills, nausea, or vomiting . Bowel or bladder dysfunction (i.e. inability to urinate or pass stool or difficulty controlling either)  How long does it take for the procedure to work? You should feel relief from your usual pain within the first hour. Again, this is only expected to last for several hours, at the most. Remember, you may be sore in the middle part of your back from the needles, and you must distinguish this from your usual pain. ____________________________________________________________________________________________   Radiofrequency Lesioning Radiofrequency lesioning is a procedure that is performed to relieve pain. The procedure is often used for back, neck, or arm pain. Radiofrequency lesioning involves the use of a machine that creates radio waves to make heat. During the procedure, the heat is applied to the nerve that carries the pain signal. The heat damages the nerve and interferes with the pain signal. Pain relief usually starts about 2 weeks after the procedure and lasts for 6 months to 1 year. Tell a health care provider about:  Any allergies you have.  All medicines you are taking, including vitamins, herbs, eye drops, creams, and over-the-counter medicines.  Any problems you or family members have had with anesthetic medicines.  Any blood disorders you have.  Any surgeries you have had.  Any medical conditions you have.  Whether you are pregnant or may be pregnant. What are the risks? Generally, this is a safe procedure. However, problems may occur, including:  Pain or soreness at the injection site.  Infection at the injection site.  Damage to nerves or blood vessels.  What happens before the procedure?  Ask your health care provider about: ? Changing or stopping your regular medicines. This is  especially important if you are taking diabetes medicines or blood thinners. ? Taking medicines such as aspirin and ibuprofen. These medicines can thin your blood. Do not take these medicines before your procedure if your health care provider instructs you not to.  Follow instructions from your health care provider about eating or drinking restrictions.  Plan to have someone take you home after the procedure.  If you go home right after the procedure, plan to have someone with you for 24 hours. What happens during the procedure?  You will be given one or more of the following: ? A medicine to help you relax (sedative). ? A medicine to numb the area (local anesthetic).  You will be awake during the procedure. You will need to be able to talk with the health care provider during the procedure.  With the help of a type of X-ray (fluoroscopy), the health care provider will insert a radiofrequency needle into the area to be treated.  Next, a wire that carries the radio waves (electrode) will be put through the radiofrequency needle. An electrical pulse will be sent through the electrode to verify the correct nerve. You will feel  a tingling sensation, and you may have muscle twitching.  Then, the tissue that is around the needle tip will be heated by an electric current that is passed using the radiofrequency machine. This will numb the nerves.  A bandage (dressing) will be put on the insertion area after the procedure is done. The procedure may vary among health care providers and hospitals. What happens after the procedure?  Your blood pressure, heart rate, breathing rate, and blood oxygen level will be monitored often until the medicines you were given have worn off.  Return to your normal activities as directed by your health care provider. This information is not intended to replace advice given to you by your health care provider. Make sure you discuss any questions you have with your  health care provider. Document Released: 11/27/2010 Document Revised: 09/06/2015 Document Reviewed: 05/08/2014 Elsevier Interactive Patient Education  [employer].

## 2018-03-29 NOTE — Patient Instructions (Signed)
____________________________________________________________________________________________  Genicular Nerve Block  What is a genicular nerve block? A genicular nerve block is the injection of a local anesthetic to block the nerves that transmits pain from the knee.  What is the purpose of a facet nerve block? A genicular nerve block is a diagnostic procedure to determine if the pathologic changes (i.e. arthritis, meniscal tears, etc) and inflammation within the knee joint is the source of your knee pain. It also confirms that the knee pain will respond well to the actual treatment procedure. If a genicular nerve block works, it will give you relief for several hours. After that, the pain is expected to return to normal. This test is always performed twice (usually a week or two apart) because two successful tests are required to move onto treatment. If both diagnostic tests are positive, then we schedule a treatment called radiofrequency (RF) ablation. In this procedure, the same nerves are cauterized, which typically leads to pain relief for 4 -18 months. If this process works well for one knee, it can be performed on the other knee if needed.  How is the procedure performed? You will be placed on the procedure table. The injection site is sterilized with either iodine or chlorhexadine. The site to be injected is numbed with a local anesthetic, and a needle is directed to the target area. X-ray guidance is used to ensure proper placement and positioning of the needle. When the needle is properly positioned near the genicular nerve, local anesthetic is injected to numb that nerve. This will be repeated at multiple sites around the knee to block all genicular nerves.  Will the procedure be painful? The injection can be painful and we therefore provide the option of receiving IV sedation. IV sedation, combined with local anesthetic, can make the injection nearly pain free. It allows you to remain very  still during the procedure, which can also make the injection easier, faster, and more successful. If you decide to have IV sedation, you must have a driver to get you home safely afterwards. In addition, you cannot have anything to eat or drink within 8 hours of your appointment (clear liquids are allowed until 3 hours before the procedure). If you take medications for diabetes, these medications may need to be adjusted the morning of the procedure. Your primary care physician can help you with this adjustment.  What are the discharge instructions? If you received IV sedation do not drive or operate machinery for at least 24 hours after the procedure. You may return to work the next day following your procedure. You may resume your normal diet immediately. Do not engage in any strenuous activity for 24 hours. You should, however, engage in moderate activity that typically causes your ususal pain. If the block works, those activities should not be painful for several hours after the injection. Do not take a bath, swim, or use a hot tub for 24 hours (you may take a shower). Call the office if you have any of the following: severe pain afterwards (different than your usual symptoms), redness/swelling/discharge at the injection site(s), fevers/chills, difficulty with bowel or bladder functions.  What are the risks and side effects? The complication rate for this procedure is very low. Whenever a needle enters the skin, bleeding or infection can occur. Some other serious but extremely rare risks include paralysis and death. You may have an allergic reaction to any of the medications used. If you have a known allergy to any medications, especially local anesthetics, notify  our staff before the procedure takes place. You may experience any of the following side effects up to 4 - 6 hours after the procedure: . Leg muscle weakness or numbness may occur due to the local anesthetic affecting the nerves that control  your legs (this is a temporary affect and it is not paralysis). If you have any leg weakness or numbness, walk only with assistance in order to prevent falls and injury. Your leg strength will return slowly and completely. . Dizziness may occur due to a decrease in your blood pressure. If this occurs, remain in a seated or lying position. Gradually sit up, and then stand after at least 10 minutes of sitting. . Mild headaches may occur. Drink fluids and take pain medications if needed. If the headaches persist or become severe, call the office. . Mild discomfort at the injection site can occur. This typically lasts for a few hours but can persist for a couple days. If this occurs, take anti-inflammatories or pain medications, apply ice to the area the day of the procedure. If it persists, apply moist heat in the day(s) following.  The side effects listed above can be normal. They are not dangerous and will resolve on their own. If, however, you experience any of the following, a complication may have occurred and you should either contact your doctor. If he is not readily available, then you should proceed to the closest urgent care center for evaluation: . Severe or progressive pain at the injection site(s) . Arm or leg weakness that progressively worsens or persists for longer than 8 hours . Severe or progressive redness, swelling, or discharge from the injections site(s) . Fevers, chills, nausea, or vomiting . Bowel or bladder dysfunction (i.e. inability to urinate or pass stool or difficulty controlling either)  How long does it take for the procedure to work? You should feel relief from your usual pain within the first hour. Again, this is only expected to last for several hours, at the most. Remember, you may be sore in the middle part of your back from the needles, and you must distinguish this from your usual  pain. ____________________________________________________________________________________________   Radiofrequency Lesioning Radiofrequency lesioning is a procedure that is performed to relieve pain. The procedure is often used for back, neck, or arm pain. Radiofrequency lesioning involves the use of a machine that creates radio waves to make heat. During the procedure, the heat is applied to the nerve that carries the pain signal. The heat damages the nerve and interferes with the pain signal. Pain relief usually starts about 2 weeks after the procedure and lasts for 6 months to 1 year. Tell a health care provider about:  Any allergies you have.  All medicines you are taking, including vitamins, herbs, eye drops, creams, and over-the-counter medicines.  Any problems you or family members have had with anesthetic medicines.  Any blood disorders you have.  Any surgeries you have had.  Any medical conditions you have.  Whether you are pregnant or may be pregnant. What are the risks? Generally, this is a safe procedure. However, problems may occur, including:  Pain or soreness at the injection site.  Infection at the injection site.  Damage to nerves or blood vessels.  What happens before the procedure?  Ask your health care provider about: ? Changing or stopping your regular medicines. This is especially important if you are taking diabetes medicines or blood thinners. ? Taking medicines such as aspirin and ibuprofen. These medicines can thin  your blood. Do not take these medicines before your procedure if your health care provider instructs you not to.  Follow instructions from your health care provider about eating or drinking restrictions.  Plan to have someone take you home after the procedure.  If you go home right after the procedure, plan to have someone with you for 24 hours. What happens during the procedure?  You will be given one or more of the following: ? A  medicine to help you relax (sedative). ? A medicine to numb the area (local anesthetic).  You will be awake during the procedure. You will need to be able to talk with the health care provider during the procedure.  With the help of a type of X-ray (fluoroscopy), the health care provider will insert a radiofrequency needle into the area to be treated.  Next, a wire that carries the radio waves (electrode) will be put through the radiofrequency needle. An electrical pulse will be sent through the electrode to verify the correct nerve. You will feel a tingling sensation, and you may have muscle twitching.  Then, the tissue that is around the needle tip will be heated by an electric current that is passed using the radiofrequency machine. This will numb the nerves.  A bandage (dressing) will be put on the insertion area after the procedure is done. The procedure may vary among health care providers and hospitals. What happens after the procedure?  Your blood pressure, heart rate, breathing rate, and blood oxygen level will be monitored often until the medicines you were given have worn off.  Return to your normal activities as directed by your health care provider. This information is not intended to replace advice given to you by your health care provider. Make sure you discuss any questions you have with your health care provider. Document Released: 11/27/2010 Document Revised: 09/06/2015 Document Reviewed: 05/08/2014 Elsevier Interactive Patient Education  [employer]2018 [employer]

## 2018-03-29 NOTE — Progress Notes (Signed)
Safety precautions to be maintained throughout the outpatient stay will include: orient to surroundings, keep bed in low position, maintain call bell within reach at all times, provide assistance with transfer out of bed and ambulation.  

## 2018-05-03 ENCOUNTER — Encounter: Payer: Self-pay | Admitting: Student in an Organized Health Care Education/Training Program

## 2018-05-03 ENCOUNTER — Other Ambulatory Visit: Payer: Self-pay

## 2018-05-03 ENCOUNTER — Ambulatory Visit
Payer: Managed Care, Other (non HMO) | Attending: Student in an Organized Health Care Education/Training Program | Admitting: Student in an Organized Health Care Education/Training Program

## 2018-05-03 ENCOUNTER — Ambulatory Visit: Admission: RE | Admit: 2018-05-03 | Payer: Managed Care, Other (non HMO) | Source: Ambulatory Visit

## 2018-05-03 DIAGNOSIS — M1711 Unilateral primary osteoarthritis, right knee: Secondary | ICD-10-CM | POA: Diagnosis present

## 2018-05-03 LAB — GLUCOSE, CAPILLARY: Glucose-Capillary: 84 mg/dL (ref 70–99)

## 2018-05-03 MED ORDER — LACTATED RINGERS IV SOLN: 1000.0000 mL | Freq: Once | INTRAVENOUS | Status: DC

## 2018-05-03 MED ORDER — SODIUM CHLORIDE 0.9 % IV SOLN
8.0000 mg | Freq: Once | INTRAVENOUS | Status: DC
  Filled 2018-05-03: qty 4

## 2018-05-03 MED ORDER — ONDANSETRON HCL 4 MG/2ML IJ SOLN
INTRAMUSCULAR | Status: AC
  Filled 2018-05-03: qty 2

## 2018-05-03 MED ORDER — ROPIVACAINE HCL 2 MG/ML IJ SOLN
10.0000 mL | Freq: Once | INTRAMUSCULAR | Status: DC
  Filled 2018-05-03: qty 10

## 2018-05-03 MED ORDER — FENTANYL CITRATE (PF) 100 MCG/2ML IJ SOLN
25.0000 ug | INTRAMUSCULAR | Status: DC | PRN
  Administered 2018-05-03: 50 ug via INTRAVENOUS
  Filled 2018-05-03: qty 2

## 2018-05-03 MED ORDER — DEXAMETHASONE SODIUM PHOSPHATE 10 MG/ML IJ SOLN
10.0000 mg | Freq: Once | INTRAMUSCULAR | Status: DC
  Filled 2018-05-03: qty 1

## 2018-05-03 MED ORDER — LIDOCAINE HCL 2 % IJ SOLN
20.0000 mL | Freq: Once | INTRAMUSCULAR | Status: DC
  Filled 2018-05-03: qty 20

## 2018-05-03 NOTE — Patient Instructions (Signed)
____________________________________________________________________________________________  Post-Procedure Discharge Instructions  Instructions:  Apply ice: Fill a plastic sandwich bag with crushed ice. Cover it with a small towel and apply to injection site. Apply for 15 minutes then remove x 15 minutes. Repeat sequence on day of procedure, until you go to bed. The purpose is to minimize swelling and discomfort after procedure.  Apply heat: Apply heat to procedure site starting the day following the procedure. The purpose is to treat any soreness and discomfort from the procedure.  Food intake: Start with clear liquids (like water) and advance to regular food, as tolerated.   Physical activities: Keep activities to a minimum for the first 8 hours after the procedure.   Driving: If you have received any sedation, you are not allowed to drive for 24 hours after your procedure.  Blood thinner: Restart your blood thinner 6 hours after your procedure. (Only for those taking blood thinners)  Insulin: As soon as you can eat, you may resume your normal dosing schedule. (Only for those taking insulin)  Infection prevention: Keep procedure site clean and dry.  Post-procedure Pain Diary: Extremely important that this be done correctly and accurately. Recorded information will be used to determine the next step in treatment.  Pain evaluated is that of treated area only. Do not include pain from an untreated area.  Complete every hour, on the hour, for the initial 8 hours. Set an alarm to help you do this part accurately.  Do not go to sleep and have it completed later. It will not be accurate.  Follow-up appointment: Keep your follow-up appointment after the procedure. Usually 2 weeks for most procedures. (6 weeks in the case of radiofrequency.) Bring you pain diary.   Expect:  From numbing medicine (AKA: Local Anesthetics): Numbness or decrease in pain.  Onset: Full effect within 15  minutes of injected.  Duration: It will depend on the type of local anesthetic used. On the average, 1 to 8 hours.   From steroids: Decrease in swelling or inflammation. Once inflammation is improved, relief of the pain will follow.  Onset of benefits: Depends on the amount of swelling present. The more swelling, the longer it will take for the benefits to be seen. In some cases, up to 10 days.  Duration: Steroids will stay in the system x 2 weeks. Duration of benefits will depend on multiple posibilities including persistent irritating factors.  Occasional side-effects: Facial flushing (red, warm cheeks) , cramps (if present, drink Gatorade and take over-the-counter Magnesium 450-500 mg once to twice a day).  From procedure: Some discomfort is to be expected once the numbing medicine wears off. This should be minimal if ice and heat are applied as instructed.  Call if:  You experience numbness and weakness that gets worse with time, as opposed to wearing off.  New onset bowel or bladder incontinence. (This applies to Spinal procedures only)  Emergency Numbers:  Durning business hours (Monday - Thursday, 8:00 AM - 4:00 PM) (Friday, 9:00 AM - 12:00 Noon): [phone]  After hours: [phone] ____________________________________________________________________________________________  ____________________________________________________________________________________________  Preparing for Procedure with Sedation  Instructions: . Oral Intake: Do not eat or drink anything for at least 8 hours prior to your procedure. . Transportation: Public transportation is not allowed. Bring an adult driver. The driver must be physically present in our waiting room before any procedure can be started. [facility] Physical Assistance: Bring an adult physically capable of assisting you, in the event you need help. This adult should keep  you company at home for at least 6 hours after the  procedure. . Blood Pressure Medicine: Take your blood pressure medicine with a sip of water the morning of the procedure. . Blood thinners: Notify our staff if you are taking any blood thinners. Depending on which one you take, there will be specific instructions on how and when to stop it. . Diabetics on insulin: Notify the staff so that you can be scheduled 1st case in the morning. If your diabetes requires high dose insulin, take only  of your normal insulin dose the morning of the procedure and notify the staff that you have done so. . Preventing infections: Shower with an antibacterial soap the morning of your procedure. . Build-up your immune system: Take 1000 mg of Vitamin C with every meal (3 times a day) the day prior to your procedure. Marland Kitchen. Antibiotics: Inform the staff if you have a condition or reason that requires you to take antibiotics before dental procedures. . Pregnancy: If you are pregnant, call and cancel the procedure. . Sickness: If you have a cold, fever, or any active infections, call and cancel the procedure. . Arrival: You must be in the facility at least 30 minutes prior to your scheduled procedure. . Children: Do not bring children with you. . Dress appropriately: Bring dark clothing that you would not mind if they get stained. . Valuables: Do not bring any jewelry or valuables.  Procedure appointments are reserved for interventional treatments only. Marland Kitchen. No Prescription Refills. . No medication changes will be discussed during procedure appointments. . No disability issues will be discussed.  Reasons to call and reschedule or cancel your procedure: (Following these recommendations will minimize the risk of a serious complication.) . Surgeries: Avoid having procedures within 2 weeks of any surgery. (Avoid for 2 weeks before or after any surgery). . Flu Shots: Avoid having procedures within 2 weeks of a flu shots or . (Avoid for 2 weeks before or after  immunizations). . Barium: Avoid having a procedure within 7-10 days after having had a radiological study involving the use of radiological contrast. (Myelograms, Barium swallow or enema study). . Heart attacks: Avoid any elective procedures or surgeries for the initial 6 months after a "Myocardial Infarction" (Heart Attack). . Blood thinners: It is imperative that you stop these medications before procedures. Let us know if you if you take any blood thinner.  . Infection: Avoid procedures during or within two weeks of an infection (including chest colds or gastrointestinal problems). Symptoms associated with infections include: Localized redness, fever, chills, night sweats or profuse sweating, burning sensation when voiding, cough, congestion, stuffiness, runny nose, sore throat, diarrhea, nausea, vomiting, cold or Flu symptoms, recent or current infections. It is specially important if the infection is over the area that we intend to treat. Marland Kitchen. Heart and lung problems: Symptoms that may suggest an active cardiopulmonary problem include: cough, chest pain, breathing difficulties or shortness of breath, dizziness, ankle swelling, uncontrolled high or unusually low blood pressure, and/or palpitations. If you are experiencing any of these symptoms, cancel your procedure and contact your primary care physician for an evaluation.  Remember:  Regular Business hours are:  Monday to Thursday 8:00 AM to 4:00 PM  Provider's Schedule: Delano MetzFrancisco Naveira, MD:  Procedure days: Tuesday and Thursday 7:30 AM to 4:00 PM  Edward JollyBilal Lateef, MD:  Procedure days: Monday and Wednesday 7:30 AM to 4:00 PM ____________________________________________________________________________________________  Radiofrequency Lesioning Radiofrequency lesioning is a procedure that is performed to relieve pain.  The procedure is often used for back, neck, or arm pain. Radiofrequency lesioning involves the use of a machine that creates radio  waves to make heat. During the procedure, the heat is applied to the nerve that carries the pain signal. The heat damages the nerve and interferes with the pain signal. Pain relief usually starts about 2 weeks after the procedure and lasts for 6 months to 1 year. Tell a health care provider about:  Any allergies you have.  All medicines you are taking, including vitamins, herbs, eye drops, creams, and over-the-counter medicines.  Any problems you or family members have had with anesthetic medicines.  Any blood disorders you have.  Any surgeries you have had.  Any medical conditions you have.  Whether you are pregnant or may be pregnant. What are the risks? Generally, this is a safe procedure. However, problems may occur, including:  Pain or soreness at the injection site.  Infection at the injection site.  Damage to nerves or blood vessels. What happens before the procedure?  Ask your health care provider about: ? Changing or stopping your regular medicines. This is especially important if you are taking diabetes medicines or blood thinners. ? Taking medicines such as aspirin and ibuprofen. These medicines can thin your blood. Do not take these medicines before your procedure if your health care provider instructs you not to.  Follow instructions from your health care provider about eating or drinking restrictions.  Plan to have someone take you home after the procedure.  If you go home right after the procedure, plan to have someone with you for 24 hours. What happens during the procedure?  You will be given one or more of the following: ? A medicine to help you relax (sedative). ? A medicine to numb the area (local anesthetic).  You will be awake during the procedure. You will need to be able to talk with the health care provider during the procedure.  With the help of a type of X-ray (fluoroscopy), the health care provider will insert a radiofrequency needle into the  area to be treated.  Next, a wire that carries the radio waves (electrode) will be put through the radiofrequency needle. An electrical pulse will be sent through the electrode to verify the correct nerve. You will feel a tingling sensation, and you may have muscle twitching.  Then, the tissue that is around the needle tip will be heated by an electric current that is passed using the radiofrequency machine. This will numb the nerves.  A bandage (dressing) will be put on the insertion area after the procedure is done. The procedure may vary among health care providers and hospitals. What happens after the procedure?  Your blood pressure, heart rate, breathing rate, and blood oxygen level will be monitored often until the medicines you were given have worn off.  Return to your normal activities as directed by your health care provider. This information is not intended to replace advice given to you by your health care provider. Make sure you discuss any questions you have with your health care provider. Document Released: 11/27/2010 Document Revised: 09/06/2015 Document Reviewed: 05/08/2014 [employer]vier Interactive Patient Education  2019 [employer]

## 2018-05-03 NOTE — Progress Notes (Signed)
Safety precautions to be maintained throughout the outpatient stay will include: orient to surroundings, keep bed in low position, maintain call bell within reach at all times, provide assistance with transfer out of bed and ambulation.  

## 2018-05-03 NOTE — Progress Notes (Signed)
Patient's Name: Lindsay Trevino  MRN: [id number]  Referring Provider: Edward Jolly, MD  DOB: [birth year]  PCP: Barbette Reichmann, MD  DOS: 05/03/2018  Note by: Edward Jolly, MD  Service setting: Ambulatory outpatient  Specialty: Interventional Pain Management  Patient type: Established  Location: [facility] (AMB) Pain Management Facility  Visit type: Interventional Procedure   Primary Reason for Visit: Interventional Pain Management Treatment. CC: Knee Pain (right)  Procedure:          Anesthesia, Analgesia, Anxiolysis:  Type: Therapeutic Superior-lateral, Superior-medial, and Inferior-medial, Genicular Nerve Radiofrequency Ablation.  #1  Region: Lateral, Anterior, and Medial aspects of the knee joint, above and below the knee joint proper. Level: Superior and inferior to the knee joint. Laterality: Right   PROCEDURE ABORTED 2/2 to patient sx of dizziness and nausea during procedure. See detail below.  Type: Moderate (Conscious) Sedation combined with Local Anesthesia Indication(s): Analgesia and Anxiety Route: Intravenous (IV) IV Access: Secured Sedation: Meaningful verbal contact was maintained at all times during the procedure  Local Anesthetic: Lidocaine 1-2%  Position: Supine   Indications: 1. Primary osteoarthritis of right knee    [name] has been dealing with the above chronic pain for longer than three months and has either failed to respond, was unable to tolerate, or simply did not get enough benefit from other more conservative therapies including, but not limited to: 1. Over-the-counter medications 2. Anti-inflammatory medications 3. Muscle relaxants 4. Membrane stabilizers 5. Opioids 6. Physical therapy and/or chiropractic manipulation 7. Modalities (Heat, ice, etc.) 8. Invasive techniques such as nerve blocks. Lindsay Trevino has attained more than 50% relief of the pain from a series of diagnostic injections conducted in separate occasions.  Pain Score: Pre-procedure:  5 /10 Post-procedure: 5 /10  Pre-op Assessment:  Lindsay Trevino is a 58 y.o. (year old), female patient, seen today for interventional treatment. She  has a past surgical history that includes Cesarean section; Abdominal hysterectomy; and Tonsillectomy. [name] has a current medication list which includes the following prescription(s): alprazolam, atorvastatin, butalbital-acetaminophen-caffeine, cyclobenzaprine, diclofenac, diclofenac sodium, estradiol, metoprolol succinate, and venlafaxine, and the following Facility-Administered Medications: fentanyl, lactated ringers, and ondansetron (ZOFRAN) 8 mg in sodium chloride 0.9 % 50 mL IVPB. Her primarily concern today is the Knee Pain (right)  Initial Vital Signs:  Pulse/HCG Rate: 79ECG Heart Rate: 76 Temp: 98.2 F (36.8 C) Resp: 16 BP: 121/69 SpO2: 97 %  BMI: Estimated body mass index is 37.44 kg/m as calculated from the following:   Height as of this encounter: 5\' 5"  (1.651 m).   Weight as of this encounter: 225 lb (102.1 kg).  Risk Assessment: Allergies: Reviewed. She is allergic to morphine and related and penicillins.  Allergy Precautions: None required Coagulopathies: Reviewed. None identified.  Blood-thinner therapy: None at this time Active Infection(s): Reviewed. None identified. Lindsay Trevino is afebrile  Site Confirmation: Lindsay Trevino was asked to confirm the procedure and laterality before marking the site Procedure checklist: Completed Consent: Before the procedure and under the influence of no sedative(s), amnesic(s), or anxiolytics, the patient was informed of the treatment options, risks and possible complications. To fulfill our ethical and legal obligations, as recommended by the American Medical Association's Code of Ethics, I have informed the patient of my clinical impression; the nature and purpose of the treatment or procedure; the risks, benefits, and possible complications of the intervention; the alternatives,  including doing nothing; the risk(s) and benefit(s) of the alternative treatment(s) or procedure(s); and the risk(s) and benefit(s) of doing nothing. The patient  was provided information about the general risks and possible complications associated with the procedure. These may include, but are not limited to: failure to achieve desired goals, infection, bleeding, organ or nerve damage, allergic reactions, paralysis, and death. In addition, the patient was informed of those risks and complications associated to the procedure, such as failure to decrease pain; infection; bleeding; organ or nerve damage with subsequent damage to sensory, motor, and/or autonomic systems, resulting in permanent pain, numbness, and/or weakness of one or several areas of the body; allergic reactions; (i.e.: anaphylactic reaction); and/or death. Furthermore, the patient was informed of those risks and complications associated with the medications. These include, but are not limited to: allergic reactions (i.e.: anaphylactic or anaphylactoid reaction(s)); adrenal axis suppression; blood sugar elevation that in diabetics may result in ketoacidosis or comma; water retention that in patients with history of congestive heart failure may result in shortness of breath, pulmonary edema, and decompensation with resultant heart failure; weight gain; swelling or edema; medication-induced neural toxicity; particulate matter embolism and blood vessel occlusion with resultant organ, and/or nervous system infarction; and/or aseptic necrosis of one or more joints. Finally, the patient was informed that Medicine is not an exact science; therefore, there is also the possibility of unforeseen or unpredictable risks and/or possible complications that may result in a catastrophic outcome. The patient indicated having understood very clearly. We have given the patient no guarantees and we have made no promises. Enough time was given to the patient to ask  questions, all of which were answered to the patient's satisfaction. Lindsay Trevino has indicated that she wanted to continue with the procedure. Attestation: I, the ordering provider, attest that I have discussed with the patient the benefits, risks, side-effects, alternatives, likelihood of achieving goals, and potential problems during recovery for the procedure that I have provided informed consent. Date  Time: 05/03/2018  7:59 AM  Pre-Procedure Preparation:  Monitoring: As per clinic protocol. Respiration, ETCO2, SpO2, BP, heart rate and rhythm monitor placed and checked for adequate function Safety Precautions: Patient was assessed for positional comfort and pressure points before starting the procedure. Time-out: I initiated and conducted the "Time-out" before starting the procedure, as per protocol. The patient was asked to participate by confirming the accuracy of the "Time Out" information. Verification of the correct person, site, and procedure were performed and confirmed by me, the nursing staff, and the patient. "Time-out" conducted as per Joint Commission's Universal Protocol (UP.01.01.01). Time: [id number]  Description of Procedure:          Target Area: For Genicular Nerve block(s), the targets are: the superior-lateral genicular nerve, located in the lateral distal portion of the femoral shaft as it curves to form the lateral epicondyle, in the region of the distal femoral metaphysis; the superior-medial genicular nerve, located in the medial distal portion of the femoral shaft as it curves to form the medial epicondyle; and the inferior-medial genicular nerve, located in the medial, proximal portion of the tibial shaft, as it curves to form the medial epicondyle, in the region of the proximal tibial metaphysis. Approach: Anterior, ipsilateral approach. Area Prepped: Entire knee area, from mid-thigh to mid-shin, lateral, anterior, and medial aspects. Prepping solution: Hibiclens (4.0%  Chlorhexidine gluconate solution)  Patient received 50 mcg of fentanyl and procedure was started.  We were taking fluoroscopy images to localize the target nerves when the patient started to note feeling dizzy, nauseous and difficulty breathing.  At that time we sat the patient up, opened up her fluids and  also cycled her vital signs.  Patient's vital signs were unremarkable and patient was not hypotensive or bradycardic.  Pulse ox was also greater than 93%.  We did place oxygen, patient received 4 mg of Zofran x2.  The patient started to feel better but wanted to reschedule the procedure.  In anticipation for her next procedure, we will avoid fentanyl.  Patient will only receive midazolam as an anxiolytic.  Also recommend premedication with Zofran and fluid bolus of 250 cc before starting procedure.  Patient was discharged in improved condition.  Her nausea and dizziness are improving.  Vital signs were stable at the time of discharge.  Patient will follow-up in the next 1 to 2 weeks for right genicular nerve RFA.     Vitals:   05/03/18 0920 05/03/18 0925 05/03/18 0930 05/03/18 0935  BP: 119/61 118/63 124/73 126/74  Pulse:      Resp: 20 18 11 15   Temp:  (!) 97.1 F (36.2 C)  (!) 97.5 F (36.4 C)  TempSrc:      SpO2: 100% 99% 100% 100%  Weight:      Height:          Antibiotic Prophylaxis:   Anti-infectives (From admission, onward)   None     Indication(s): None identified  Post-operative Assessment:  Post-procedure Vital Signs:  Pulse/HCG Rate: 7972 Temp: (!) 97.5 F (36.4 C) Resp: 15 BP: 126/74 SpO2: 100 %  EBL: None  Complications: Procedure aborted, will postpone secondary to patient nausea and dizziness prior to procedure. See details above  Comments:  No additional relevant information.  Plan of Care    Medications ordered for procedure: Meds ordered this encounter  Medications  . lactated ringers infusion 1,000 mL  . fentaNYL (SUBLIMAZE) injection 25-100  mcg    Make sure Narcan is available in the pyxis when using this medication. In the event of respiratory depression (RR< 8/min): Titrate NARCAN (naloxone) in increments of 0.1 to 0.2 mg IV at 2-3 minute intervals, until desired degree of reversal.  . DISCONTD: ropivacaine (PF) 2 mg/mL (0.2%) (NAROPIN) injection 10 mL  . DISCONTD: lidocaine (XYLOCAINE) 2 % (with pres) injection 400 mg  . DISCONTD: dexamethasone (DECADRON) injection 10 mg  . ondansetron (ZOFRAN) 8 mg in sodium chloride 0.9 % 50 mL IVPB   Medications administered: We administered fentaNYL.  See the medical record for exact dosing, route, and time of administration.   Future Appointments  Date Time Provider Department Center  05/12/2018  8:00 AM Edward Jolly, MD [facility] None   Primary Care Physician: Barbette Reichmann, MD Location: [facility] Facility Note by: Edward Jolly, MD Date: 05/03/2018; Time: 10:38 AM  Disclaimer:  Medicine is not an exact science. The only guarantee in medicine is that nothing is guaranteed. It is important to note that the decision to proceed with this intervention was based on the information collected from the patient. The Data and conclusions were drawn from the patient's questionnaire, the interview, and the physical examination. Because the information was provided in large part by the patient, it cannot be guaranteed that it has not been purposely or unconsciously manipulated. Every effort has been made to obtain as much relevant data as possible for this evaluation. It is important to note that the conclusions that lead to this procedure are derived in large part from the available data. Always take into account that the treatment will also be dependent on availability of resources and existing treatment guidelines, considered by other Pain Management  Practitioners as being common knowledge and practice, at the time of the intervention. For Medico-Legal purposes, it is also  important to point out that variation in procedural techniques and pharmacological choices are the acceptable norm. The indications, contraindications, technique, and results of the above procedure should only be interpreted and judged by a Board-Certified Interventional Pain Specialist with extensive familiarity and expertise in the same exact procedure and technique.

## 2018-05-04 ENCOUNTER — Telehealth: Payer: Self-pay | Admitting: *Deleted

## 2018-05-04 NOTE — Telephone Encounter (Signed)
Attempted to call for post procedure follow-up. Unable to leave a message. 

## 2018-05-12 ENCOUNTER — Ambulatory Visit
Admission: RE | Admit: 2018-05-12 | Discharge: 2018-05-12 | Disposition: A | Discharge status: Home / Self Care | Payer: Managed Care, Other (non HMO) | Source: Ambulatory Visit | Attending: Student in an Organized Health Care Education/Training Program | Admitting: Student in an Organized Health Care Education/Training Program

## 2018-05-12 ENCOUNTER — Ambulatory Visit (HOSPITAL_BASED_OUTPATIENT_CLINIC_OR_DEPARTMENT_OTHER): Payer: Managed Care, Other (non HMO) | Admitting: Student in an Organized Health Care Education/Training Program

## 2018-05-12 ENCOUNTER — Other Ambulatory Visit: Payer: Self-pay

## 2018-05-12 ENCOUNTER — Encounter: Payer: Self-pay | Admitting: Student in an Organized Health Care Education/Training Program

## 2018-05-12 VITALS — BP 122/67 | HR 73 | Temp 97.4°F | Resp 18 | Ht 64.0 in | Wt 220.0 lb

## 2018-05-12 DIAGNOSIS — G8929 Other chronic pain: Secondary | ICD-10-CM

## 2018-05-12 DIAGNOSIS — M1711 Unilateral primary osteoarthritis, right knee: Secondary | ICD-10-CM

## 2018-05-12 DIAGNOSIS — M25561 Pain in right knee: Secondary | ICD-10-CM | POA: Insufficient documentation

## 2018-05-12 MED ORDER — ROPIVACAINE HCL 2 MG/ML IJ SOLN
10.0000 mL | Freq: Once | INTRAMUSCULAR | Status: AC
  Administered 2018-05-12: 10 mL
  Filled 2018-05-12: qty 10

## 2018-05-12 MED ORDER — LACTATED RINGERS IV SOLN
1000.0000 mL | Freq: Once | INTRAVENOUS | Status: AC
  Administered 2018-05-12: 1000 mL via INTRAVENOUS

## 2018-05-12 MED ORDER — LIDOCAINE HCL 2 % IJ SOLN
20.0000 mL | Freq: Once | INTRAMUSCULAR | Status: AC
  Administered 2018-05-12: 400 mg
  Filled 2018-05-12: qty 20

## 2018-05-12 MED ORDER — MIDAZOLAM HCL 5 MG/5ML IJ SOLN
1.0000 mg | INTRAMUSCULAR | Status: DC | PRN
  Administered 2018-05-12: 1 mg via INTRAVENOUS
  Filled 2018-05-12: qty 5

## 2018-05-12 MED ORDER — DEXAMETHASONE SODIUM PHOSPHATE 10 MG/ML IJ SOLN
10.0000 mg | Freq: Once | INTRAMUSCULAR | Status: AC
  Administered 2018-05-12: 10 mg
  Filled 2018-05-12: qty 1

## 2018-05-12 MED ORDER — ONDANSETRON HCL 4 MG/2ML IJ SOLN
4.0000 mg | Freq: Once | INTRAMUSCULAR | Status: AC
  Administered 2018-05-12: 4 mg via INTRAVENOUS
  Filled 2018-05-12: qty 2

## 2018-05-12 NOTE — Patient Instructions (Signed)

## 2018-05-12 NOTE — Progress Notes (Signed)
Patient's Name: Lindsay Trevino  MRN: [id number]  Referring Provider: Barbette Reichmann, MD  DOB: 06-20-1960  PCP: Barbette Reichmann, MD  DOS: 05/12/2018  Note by: Edward Jolly, MD  Service setting: Ambulatory outpatient  Specialty: Interventional Pain Management  Patient type: Established  Location: [facility] (AMB) Pain Management Facility  Visit type: Interventional Procedure   Primary Reason for Visit: Interventional Pain Management Treatment. CC: Knee Pain (right)  Procedure:          Anesthesia, Analgesia, Anxiolysis:  Type: Therapeutic Superior-lateral, Superior-medial, and Inferior-medial, Genicular Nerve Radiofrequency Ablation.  #1  Region: Lateral, Anterior, and Medial aspects of the knee joint, above and below the knee joint proper. Level: Superior and inferior to the knee joint. Laterality: Right  Type: Moderate (Conscious) Sedation combined with Local Anesthesia Indication(s): Analgesia and Anxiety Route: Intravenous (IV) IV Access: Secured Sedation: Meaningful verbal contact was maintained at all times during the procedure  Local Anesthetic: Lidocaine 1-2%  Position: Supine   Indications: 1. Primary osteoarthritis of right knee   2. Chronic pain of right knee    Lindsay Trevino has been dealing with the above chronic pain for longer than three months and has either failed to respond, was unable to tolerate, or simply did not get enough benefit from other more conservative therapies including, but not limited to: 1. Over-the-counter medications 2. Anti-inflammatory medications 3. Muscle relaxants 4. Membrane stabilizers 5. Opioids 6. Physical therapy and/or chiropractic manipulation 7. Modalities (Heat, ice, etc.) 8. Invasive techniques such as nerve blocks. [name] has attained more than 50% relief of the pain from a series of diagnostic injections conducted in separate occasions.  Pain Score: Pre-procedure: 5 /10 Post-procedure: 0-No pain/10  Pre-op Assessment:  Ms.  Peggs is a 58 y.o. (year old), female patient, seen today for interventional treatment. She  has a past surgical history that includes Cesarean section; Abdominal hysterectomy; and Tonsillectomy. Lindsay Trevino has a current medication list which includes the following prescription(s): alprazolam, atorvastatin, butalbital-acetaminophen-caffeine, cyclobenzaprine, diclofenac, diclofenac sodium, estradiol, metoprolol succinate, and venlafaxine, and the following Facility-Administered Medications: midazolam. Her primarily concern today is the Knee Pain (right)  Initial Vital Signs:  Pulse/HCG Rate: 73ECG Heart Rate: 72 Temp: 98 F (36.7 C) Resp: 18 BP: 128/76 SpO2: 100 %  BMI: Estimated body mass index is 37.76 kg/m as calculated from the following:   Height as of this encounter: 5\' 4"  (1.626 m).   Weight as of this encounter: 220 lb (99.8 kg).  Risk Assessment: Allergies: Reviewed. She is allergic to morphine and related and penicillins.  Allergy Precautions: None required Coagulopathies: Reviewed. None identified.  Blood-thinner therapy: None at this time Active Infection(s): Reviewed. None identified. Lindsay Trevino is afebrile  Site Confirmation: Lindsay Trevino was asked to confirm the procedure and laterality before marking the site Procedure checklist: Completed Consent: Before the procedure and under the influence of no sedative(s), amnesic(s), or anxiolytics, the patient was informed of the treatment options, risks and possible complications. To fulfill our ethical and legal obligations, as recommended by the American Medical Association's Code of Ethics, I have informed the patient of my clinical impression; the nature and purpose of the treatment or procedure; the risks, benefits, and possible complications of the intervention; the alternatives, including doing nothing; the risk(s) and benefit(s) of the alternative treatment(s) or procedure(s); and the risk(s) and benefit(s) of doing  nothing. The patient was provided information about the general risks and possible complications associated with the procedure. These may include, but are not limited to: failure to  achieve desired goals, infection, bleeding, organ or nerve damage, allergic reactions, paralysis, and death. In addition, the patient was informed of those risks and complications associated to the procedure, such as failure to decrease pain; infection; bleeding; organ or nerve damage with subsequent damage to sensory, motor, and/or autonomic systems, resulting in permanent pain, numbness, and/or weakness of one or several areas of the body; allergic reactions; (i.e.: anaphylactic reaction); and/or death. Furthermore, the patient was informed of those risks and complications associated with the medications. These include, but are not limited to: allergic reactions (i.e.: anaphylactic or anaphylactoid reaction(s)); adrenal axis suppression; blood sugar elevation that in diabetics may result in ketoacidosis or comma; water retention that in patients with history of congestive heart failure may result in shortness of breath, pulmonary edema, and decompensation with resultant heart failure; weight gain; swelling or edema; medication-induced neural toxicity; particulate matter embolism and blood vessel occlusion with resultant organ, and/or nervous system infarction; and/or aseptic necrosis of one or more joints. Finally, the patient was informed that Medicine is not an exact science; therefore, there is also the possibility of unforeseen or unpredictable risks and/or possible complications that may result in a catastrophic outcome. The patient indicated having understood very clearly. We have given the patient no guarantees and we have made no promises. Enough time was given to the patient to ask questions, all of which were answered to the patient's satisfaction. Lindsay Trevino has indicated that she wanted to continue with the  procedure. Attestation: I, the ordering provider, attest that I have discussed with the patient the benefits, risks, side-effects, alternatives, likelihood of achieving goals, and potential problems during recovery for the procedure that I have provided informed consent. Date  Time: 05/12/2018  7:53 AM  Pre-Procedure Preparation:  Monitoring: As per clinic protocol. Respiration, ETCO2, SpO2, BP, heart rate and rhythm monitor placed and checked for adequate function Safety Precautions: Patient was assessed for positional comfort and pressure points before starting the procedure. Time-out: I initiated and conducted the "Time-out" before starting the procedure, as per protocol. The patient was asked to participate by confirming the accuracy of the "Time Out" information. Verification of the correct person, site, and procedure were performed and confirmed by me, the nursing staff, and the patient. "Time-out" conducted as per Joint Commission's Universal Protocol (UP.01.01.01). Time: 0830  Description of Procedure:          Target Area: For Genicular Nerve block(s), the targets are: the superior-lateral genicular nerve, located in the lateral distal portion of the femoral shaft as it curves to form the lateral epicondyle, in the region of the distal femoral metaphysis; the superior-medial genicular nerve, located in the medial distal portion of the femoral shaft as it curves to form the medial epicondyle; and the inferior-medial genicular nerve, located in the medial, proximal portion of the tibial shaft, as it curves to form the medial epicondyle, in the region of the proximal tibial metaphysis. Approach: Anterior, ipsilateral approach. Area Prepped: Entire knee area, from mid-thigh to mid-shin, lateral, anterior, and medial aspects. Prepping solution: Hibiclens (4.0% Chlorhexidine gluconate solution) Safety Precautions: Aspiration looking for blood return was conducted prior to all injections. At no  point did we inject any substances, as a needle was being advanced. No attempts were made at seeking any paresthesias. Safe injection practices and needle disposal techniques used. Medications properly checked for expiration dates. SDV (single dose vial) medications used. Description of the Procedure: Protocol guidelines were followed. The patient was placed in position over the procedure  table. The target area was identified and the area prepped in the usual manner. The skin and muscle were infiltrated with local anesthetic. Appropriate amount of time allowed to pass for local anesthetics to take effect. Radiofrequency needles were introduced to the target area using fluoroscopic guidance. Using the NeuroTherm NT1100 Radiofrequency Generator, sensory stimulation using 50 Hz was used to locate & identify the nerve, making sure that the needle was positioned such that there was no sensory stimulation below 0.3 V or above 0.7 V. Stimulation using 2 Hz was used to evaluate the motor component. Care was taken not to lesion any nerves that demonstrated motor stimulation of the lower extremities at an output of less than 2.5 times that of the sensory threshold, or a maximum of 2.0 V. Once satisfactory placement of the needles was achieved, the numbing solution was slowly injected after negative aspiration. After waiting for at least 2 minutes, the ablation was performed at 80 degrees C for 60 seconds, using regular Radiofrequency settings. Once the procedure was completed, the needles were then removed and the area cleansed, making sure to leave some of the prepping solution back to take advantage of its long term bactericidal properties. Intra-operative Compliance: Compliant Vitals:   05/12/18 0858 05/12/18 0908 05/12/18 0918 05/12/18 0927  BP: (!) 146/77 (!) 144/78 131/68 122/67  Pulse:      Resp: 11 18 17 18   Temp:  (!) 97.5 F (36.4 C)  (!) 97.4 F (36.3 C)  TempSrc:      SpO2: 98% 100% 98% 97%  Weight:       Height:        Start Time: 0830 hrs. End Time: 0857 hrs. Materials & Medications:  Needle(s) Type: Teflon-coated, curved tip, Radiofrequency needle(s) Gauge: 22G Length: 10cm Medication(s): Please see orders for medications and dosing details. 5 cc solution made of 4 cc of 0.2% Ropivacaine and 1 cc of Decadron 10 mg/cc. 1.5 cc injected at each level post ablation. Imaging Guidance (Non-Spinal):          Type of Imaging Technique: Fluoroscopy Guidance (Non-Spinal) Indication(s): Assistance in needle guidance and placement for procedures requiring needle placement in or near specific anatomical locations not easily accessible without such assistance. Exposure Time: Please see nurses notes. Contrast: Before injecting any contrast, we confirmed that the patient did not have an allergy to iodine, shellfish, or radiological contrast. Once satisfactory needle placement was completed at the desired level, radiological contrast was injected. Contrast injected under live fluoroscopy. No contrast complications. See chart for type and volume of contrast used. Fluoroscopic Guidance: I was personally present during the use of fluoroscopy. "Tunnel Vision Technique" used to obtain the best possible view of the target area. Parallax error corrected before commencing the procedure. "Direction-depth-direction" technique used to introduce the needle under continuous pulsed fluoroscopy. Once target was reached, antero-posterior, oblique, and lateral fluoroscopic projection used confirm needle placement in all planes. Images permanently stored in EMR. Interpretation: I personally interpreted the imaging intraoperatively. Adequate needle placement confirmed in multiple planes. Appropriate spread of contrast into desired area was observed. No evidence of afferent or efferent intravascular uptake. Permanent images saved into the patient's record.  Antibiotic Prophylaxis:   Anti-infectives (From admission, onward)    None     Indication(s): None identified  Post-operative Assessment:  Post-procedure Vital Signs:  Pulse/HCG Rate: 7373 Temp: (!) 97.4 F (36.3 C) Resp: 18 BP: 122/67 SpO2: 97 %  EBL: None  Complications: No immediate post-treatment complications observed by team, or reported  by patient.  Note: The patient tolerated the entire procedure well. A repeat set of vitals were taken after the procedure and the patient was kept under observation following institutional policy, for this type of procedure. Post-procedural neurological assessment was performed, showing return to baseline, prior to discharge. The patient was provided with post-procedure discharge instructions, including a section on how to identify potential problems. Should any problems arise concerning this procedure, the patient was given instructions to immediately contact us, at any time, without hesitation. In any case, we plan to contact the patient by telephone for a follow-up status report regarding this interventional procedure.  Comments:  No additional relevant information.  Plan of Care    Imaging Orders     DG C-Arm 1-60 Min-No Report Procedure Orders    No procedure(s) ordered today    Medications ordered for procedure: Meds ordered this encounter  Medications  . lactated ringers infusion 1,000 mL  . midazolam (VERSED) 5 MG/5ML injection 1-2 mg    Make sure Flumazenil is available in the pyxis when using this medication. If oversedation occurs, administer 0.2 mg IV over 15 sec. If after 45 sec no response, administer 0.2 mg again over 1 min; may repeat at 1 min intervals; not to exceed 4 doses (1 mg)  . lidocaine (XYLOCAINE) 2 % (with pres) injection 400 mg  . ropivacaine (PF) 2 mg/mL (0.2%) (NAROPIN) injection 10 mL  . dexamethasone (DECADRON) injection 10 mg  . ondansetron (ZOFRAN) injection 4 mg   Medications administered: We administered lactated ringers, midazolam, lidocaine, ropivacaine (PF) 2 mg/mL  (0.2%), dexamethasone, and ondansetron.  See the medical record for exact dosing, route, and time of administration.  Disposition: Discharge home  Discharge Date & Time: 05/12/2018; 0928 hrs.   Physician-requested Follow-up: Return in about 6 weeks (around 06/23/2018) for Post Procedure Evaluation.  Future Appointments  Date Time Provider Department Center  06/23/2018  1:30 PM Edward Jolly, MD [facility] None   Primary Care Physician: Barbette Reichmann, MD Location: [facility] Note by: Edward Jolly, MD Date: 05/12/2018; Time: 10:06 AM  Disclaimer:  Medicine is not an exact science. The only guarantee in medicine is that nothing is guaranteed. It is important to note that the decision to proceed with this intervention was based on the information collected from the patient. The Data and conclusions were drawn from the patient's questionnaire, the interview, and the physical examination. Because the information was provided in large part by the patient, it cannot be guaranteed that it has not been purposely or unconsciously manipulated. Every effort has been made to obtain as much relevant data as possible for this evaluation. It is important to note that the conclusions that lead to this procedure are derived in large part from the available data. Always take into account that the treatment will also be dependent on availability of resources and existing treatment guidelines, considered by other Pain Management Practitioners as being common knowledge and practice, at the time of the intervention. For Medico-Legal purposes, it is also important to point out that variation in procedural techniques and pharmacological choices are the acceptable norm. The indications, contraindications, technique, and results of the above procedure should only be interpreted and judged by a Board-Certified Interventional Pain Specialist with extensive familiarity and expertise in the same exact  procedure and technique.

## 2018-05-12 NOTE — Progress Notes (Signed)
Safety precautions to be maintained throughout the outpatient stay will include: orient to surroundings, keep bed in low position, maintain call bell within reach at all times, provide assistance with transfer out of bed and ambulation.  

## 2018-05-13 ENCOUNTER — Telehealth: Payer: Self-pay | Admitting: *Deleted

## 2018-05-13 NOTE — Telephone Encounter (Signed)
Spoke with patient re; procedure on yesterday.  Verbalizes no questions or concerns.  

## 2018-06-01 ENCOUNTER — Encounter: Payer: 59 | Admitting: Student in an Organized Health Care Education/Training Program

## 2018-06-23 ENCOUNTER — Encounter: Payer: Self-pay | Admitting: Student in an Organized Health Care Education/Training Program

## 2018-06-23 ENCOUNTER — Ambulatory Visit
Payer: Managed Care, Other (non HMO) | Attending: Student in an Organized Health Care Education/Training Program | Admitting: Student in an Organized Health Care Education/Training Program

## 2018-06-23 ENCOUNTER — Other Ambulatory Visit: Payer: Self-pay

## 2018-06-23 VITALS — BP 123/67 | HR 87 | Temp 98.2°F | Resp 16 | Ht 65.0 in | Wt 220.0 lb

## 2018-06-23 DIAGNOSIS — M25561 Pain in right knee: Secondary | ICD-10-CM

## 2018-06-23 DIAGNOSIS — G894 Chronic pain syndrome: Secondary | ICD-10-CM

## 2018-06-23 DIAGNOSIS — G8929 Other chronic pain: Secondary | ICD-10-CM

## 2018-06-23 DIAGNOSIS — Z6835 Body mass index (BMI) 35.0-35.9, adult: Secondary | ICD-10-CM

## 2018-06-23 DIAGNOSIS — M1711 Unilateral primary osteoarthritis, right knee: Secondary | ICD-10-CM | POA: Diagnosis not present

## 2018-06-23 DIAGNOSIS — E669 Obesity, unspecified: Secondary | ICD-10-CM | POA: Diagnosis not present

## 2018-06-23 DIAGNOSIS — E66812 Obesity, class 2: Secondary | ICD-10-CM

## 2018-06-23 MED ORDER — DICLOFENAC SODIUM 75 MG PO TBEC: 75.0000 mg | DELAYED_RELEASE_TABLET | Freq: Two times a day (BID) | ORAL | 2 refills | Status: AC

## 2018-06-23 NOTE — Progress Notes (Signed)
Safety precautions to be maintained throughout the outpatient stay will include: orient to surroundings, keep bed in low position, maintain call bell within reach at all times, provide assistance with transfer out of bed and ambulation.  

## 2018-06-23 NOTE — Progress Notes (Signed)
Patient's Name: Lindsay Trevino  MRN: [id number]  Referring Provider: Barbette ReichmannHande, Vishwanath, MD  DOB: 1961-02-02  PCP: Barbette ReichmannHande, Vishwanath, MD  DOS: 06/23/2018  Note by: Edward JollyBilal Kyaira Trantham, MD  Service setting: Ambulatory outpatient  Specialty: Interventional Pain Management  Location: [facility] (AMB) Pain Management Facility    Patient type: Established   Primary Reason(s) for Visit: Encounter for post-procedure evaluation of chronic illness with mild to moderate exacerbation CC: Knee Pain (right)  HPI  Lindsay Trevino is a 58 y.o. year old, female patient, who comes today for a post-procedure evaluation. She has Primary osteoarthritis of right knee; Chronic pain of right knee; Class 2 obesity with body mass index (BMI) of 35.0 to 35.9 in adult; and Chronic pain syndrome on their problem list. Her primarily concern today is the Knee Pain (right)  Pain Assessment: Location: Right Knee Radiating: to right ankle, beside shin Onset: More than a month ago Duration: Chronic pain Quality: Aching, Constant, Sharp, Shooting Severity: 5 /10 (subjective, self-reported pain score)  Note: Reported level is compatible with observation.                         When using our objective Pain Scale, levels between 6 and 10/10 are said to belong in an emergency room, as it progressively worsens from a 6/10, described as severely limiting, requiring emergency care not usually available at an outpatient pain management facility. At a 6/10 level, communication becomes difficult and requires great effort. Assistance to reach the emergency department may be required. Facial flushing and profuse sweating along with potentially dangerous increases in heart rate and blood pressure will be evident. Effect on ADL: limits daily activitiy Timing: Constant Modifying factors: procedure BP: 123/67  HR: 87  Lindsay Trevino comes in today for post-procedure evaluation.  Further details on both, my assessment(s), as well as the proposed treatment  plan, please see below.  Post-Procedure Assessment  05/12/2018 Procedure: Right knee genicular nerve RFA  Influential Factors: BMI: 36.61 kg/m Intra-procedural challenges: None observed.         Assessment challenges: None detected.              Reported side-effects: None.        Post-procedural adverse reactions or complications: None reported         Sedation: Please see nurses note. When no sedatives are used, the analgesic levels obtained are directly associated to the effectiveness of the local anesthetics. However, when sedation is provided, the level of analgesia obtained during the initial 1 hour following the intervention, is believed to be the result of a combination of factors. These factors may include, but are not limited to: 1. The effectiveness of the local anesthetics used. 2. The effects of the analgesic(s) and/or anxiolytic(s) used. 3. The degree of discomfort experienced by the patient at the time of the procedure. 4. The patients ability and reliability in recalling and recording the events. 5. The presence and influence of possible secondary gains and/or psychosocial factors. Reported result: Relief experienced during the 1st hour after the procedure: 100 % (Ultra-Short Term Relief)            Interpretative annotation: Clinically appropriate result. Analgesia during this period is likely to be Local Anesthetic and/or IV Sedative (Analgesic/Anxiolytic) related.          Effects of local anesthetic: The analgesic effects attained during this period are directly associated to the localized infiltration of local anesthetics and therefore cary significant diagnostic value as  to the etiological location, or anatomical origin, of the pain. Expected duration of relief is directly dependent on the pharmacodynamics of the local anesthetic used. Long-acting (4-6 hours) anesthetics used.  Reported result: Relief during the next 4 to 6 hour after the procedure: 100 % (Short-Term  Relief)            Interpretative annotation: Clinically appropriate result. Analgesia during this period is likely to be Local Anesthetic-related.          Long-term benefit: Defined as the period of time past the expected duration of local anesthetics (1 hour for short-acting and 4-6 hours for long-acting). With the possible exception of prolonged sympathetic blockade from the local anesthetics, benefits during this period are typically attributed to, or associated with, other factors such as analgesic sensory neuropraxia, antiinflammatory effects, or beneficial biochemical changes provided by agents other than the local anesthetics.  Reported result: Extended relief following procedure: 100 %(X2 weeks when pain returned to "5", based on level of activity; New radiating pain beginning 06/22/18-radiates from right knee to ankle, does not include shin, feels like it goes through muscle beside shin") (Long-Term Relief)            Interpretative annotation: Clinically possible results. Good relief. Therapeutic success. Inflammation plays a part in the etiology to the pain.          Current benefits: Defined as reported results that persistent at this point in time.   Analgesia: 75 %            Function: Somewhat improved ROM: Somewhat improved Interpretative annotation: Ongoing benefit. Therapeutic benefit observed. Effective therapeutic approach.          Interpretation: Results would suggest adequate radiofrequency ablation.                  Plan:  Please see "Plan of Care" for details.                 Meds   Current Outpatient Medications:  .  ALPRAZolam (XANAX) 0.25 MG tablet, Take 0.25 mg by mouth as needed for anxiety., Disp: , Rfl:  .  atorvastatin (LIPITOR) 20 MG tablet, Take 20 mg by mouth daily., Disp: , Rfl:  .  butalbital-acetaminophen-caffeine (FIORICET, ESGIC) 50-325-40 MG tablet, Take 1 tablet by mouth 2 (two) times daily as needed for headache., Disp: , Rfl:  .  cyclobenzaprine  (FLEXERIL) 5 MG tablet, Take 5 mg by mouth at bedtime., Disp: , Rfl:  .  diclofenac (VOLTAREN) 75 MG EC tablet, Take 1 tablet (75 mg total) by mouth 2 (two) times daily., Disp: 60 tablet, Rfl: 2 .  diclofenac sodium (VOLTAREN) 1 % GEL, Apply 2 g topically 4 (four) times daily., Disp: , Rfl:  .  estradiol (ESTRACE) 1 MG tablet, Take 1 mg by mouth daily., Disp: , Rfl:  .  metoprolol succinate (TOPROL-XL) 12.5 mg TB24 24 hr tablet, Take 12.5 mg by mouth daily., Disp: , Rfl:  .  venlafaxine (EFFEXOR) 75 MG tablet, Take 75 mg by mouth daily., Disp: , Rfl:   ROS  Constitutional: Denies any fever or chills Gastrointestinal: No reported hemesis, hematochezia, vomiting, or acute GI distress Musculoskeletal: Denies any acute onset joint swelling, redness, loss of ROM, or weakness Neurological: No reported episodes of acute onset apraxia, aphasia, dysarthria, agnosia, amnesia, paralysis, loss of coordination, or loss of consciousness  Allergies  Lindsay Trevino is allergic to morphine and related and penicillins.  PFSH  Drug: Lindsay Trevino  reports no  history of drug use. Alcohol:  reports no history of alcohol use. Tobacco:  reports that she has quit smoking. She has never used smokeless tobacco. Medical:  has a past medical history of Hyperlipidemia and Hypertension. Surgical: Lindsay Trevino  has a past surgical history that includes Cesarean section; Abdominal hysterectomy; and Tonsillectomy. Family: family history includes Breast cancer in her cousin.  Constitutional Exam  General appearance: Well nourished, well developed, and well hydrated. In no apparent acute distress Vitals:   06/23/18 1319  BP: 123/67  Pulse: 87  Resp: 16  Temp: 98.2 F (36.8 C)  TempSrc: Oral  SpO2: 94%  Weight: 220 lb (99.8 kg)  Height: 5\' 5"  (1.651 m)   BMI Assessment: Estimated body mass index is 36.61 kg/m as calculated from the following:   Height as of this encounter: 5\' 5"  (1.651 m).   Weight as of this  encounter: 220 lb (99.8 kg).  BMI interpretation table: BMI level Category Range association with higher incidence of chronic pain  <18 kg/m2 Underweight   18.5-24.9 kg/m2 Ideal body weight   25-29.9 kg/m2 Overweight Increased incidence by 20%  30-34.9 kg/m2 Obese (Class I) Increased incidence by 68%  35-39.9 kg/m2 Severe obesity (Class II) Increased incidence by 136%  >40 kg/m2 Extreme obesity (Class III) Increased incidence by 254%   Patient's current BMI Ideal Body weight  Body mass index is 36.61 kg/m. Ideal body weight: 57 kg (125 lb 10.6 oz) Adjusted ideal body weight: 74.1 kg (163 lb 6.4 oz)   BMI Readings from Last 4 Encounters:  06/23/18 36.61 kg/m  05/12/18 37.76 kg/m  05/03/18 37.44 kg/m  03/29/18 37.18 kg/m   Wt Readings from Last 4 Encounters:  06/23/18 220 lb (99.8 kg)  05/12/18 220 lb (99.8 kg)  05/03/18 225 lb (102.1 kg)  03/29/18 220 lb (99.8 kg)  Psych/Mental status: Alert, oriented x 3 (person, place, & time)       Eyes: PERLA Respiratory: No evidence of acute respiratory distress  Cervical Spine Area Exam  Skin & Axial Inspection: No masses, redness, edema, swelling, or associated skin lesions Alignment: Symmetrical Functional ROM: Unrestricted ROM      Stability: No instability detected Muscle Tone/Strength: Functionally intact. No obvious neuro-muscular anomalies detected. Sensory (Neurological): Unimpaired Palpation: No palpable anomalies              Upper Extremity (UE) Exam    Side: Right upper extremity  Side: Left upper extremity  Skin & Extremity Inspection: Skin color, temperature, and hair growth are WNL. No peripheral edema or cyanosis. No masses, redness, swelling, asymmetry, or associated skin lesions. No contractures.  Skin & Extremity Inspection: Skin color, temperature, and hair growth are WNL. No peripheral edema or cyanosis. No masses, redness, swelling, asymmetry, or associated skin lesions. No contractures.  Functional ROM:  Unrestricted ROM          Functional ROM: Unrestricted ROM          Muscle Tone/Strength: Functionally intact. No obvious neuro-muscular anomalies detected.  Muscle Tone/Strength: Functionally intact. No obvious neuro-muscular anomalies detected.  Sensory (Neurological): Unimpaired          Sensory (Neurological): Unimpaired          Palpation: No palpable anomalies              Palpation: No palpable anomalies              Provocative Test(s):  Phalen's test: deferred Tinel's test: deferred Apley's scratch test (touch opposite shoulder):  Action 1 (Across chest): deferred Action 2 (Overhead): deferred Action 3 (LB reach): deferred   Provocative Test(s):  Phalen's test: deferred Tinel's test: deferred Apley's scratch test (touch opposite shoulder):  Action 1 (Across chest): deferred Action 2 (Overhead): deferred Action 3 (LB reach): deferred    Thoracic Spine Area Exam  Skin & Axial Inspection: No masses, redness, or swelling Alignment: Symmetrical Functional ROM: Unrestricted ROM Stability: No instability detected Muscle Tone/Strength: Functionally intact. No obvious neuro-muscular anomalies detected. Sensory (Neurological): Unimpaired Muscle strength & Tone: No palpable anomalies  Lumbar Spine Area Exam  Skin & Axial Inspection: No masses, redness, or swelling Alignment: Symmetrical Functional ROM: Unrestricted ROM       Stability: No instability detected Muscle Tone/Strength: Functionally intact. No obvious neuro-muscular anomalies detected. Sensory (Neurological): Unimpaired Palpation: No palpable anomalies       Provocative Tests: Hyperextension/rotation test: deferred today       Lumbar quadrant test (Kemp's test): deferred today       Lateral bending test: deferred today       Patrick's Maneuver: deferred today                   FABER* test: deferred today                   S-I anterior distraction/compression test: deferred today         S-I lateral compression  test: deferred today         S-I Thigh-thrust test: deferred today         S-I Gaenslen's test: deferred today         *(Flexion, ABduction and External Rotation)  Gait & Posture Assessment  Ambulation: Unassisted Gait: Relatively normal for age and body habitus Posture: WNL   Lower Extremity Exam    Side: Right lower extremity  Side: Left lower extremity  Stability: No instability observed          Stability: No instability observed          Skin & Extremity Inspection: Skin color, temperature, and hair growth are WNL. No peripheral edema or cyanosis. No masses, redness, swelling, asymmetry, or associated skin lesions. No contractures.  Skin & Extremity Inspection: Skin color, temperature, and hair growth are WNL. No peripheral edema or cyanosis. No masses, redness, swelling, asymmetry, or associated skin lesions. No contractures.  Functional ROM: Pain restricted ROM for knee joint, improved after procedure          Functional ROM: Unrestricted ROM                  Muscle Tone/Strength: Functionally intact. No obvious neuro-muscular anomalies detected.  Muscle Tone/Strength: Functionally intact. No obvious neuro-muscular anomalies detected.  Sensory (Neurological): Improved        Sensory (Neurological): Unimpaired        DTR: Patellar: deferred today Achilles: deferred today Plantar: deferred today  DTR: Patellar: deferred today Achilles: deferred today Plantar: deferred today  Palpation: No palpable anomalies  Palpation: No palpable anomalies   Assessment   Status Diagnosis  Controlled Controlled Controlled 1. Primary osteoarthritis of right knee   2. Chronic pain of right knee   3. Class 2 obesity with body mass index (BMI) of 35.0 to 35.9 in adult, unspecified obesity type, unspecified whether serious comorbidity present   4. Chronic pain syndrome      Patient follows up today after right genicular nerve radiofrequency ablation.  Is endorsing significant pain relief around  her knee region after  the ablation.  States that she has experienced approximately 75% reduction in her pain levels around her knee.  She is describing a different type of pain that is inferior to her right lateral knee and worsened with external rotation of her hip.  She believes that this is secondary to how she has been walking and feels that is more musculoskeletal in nature.  We discussed ambulation techniques and the positioning of her foot as she is walking.  I will also refill her diclofenac as below.  Patient will follow-up PRN.  Can consider repeating genicular nerve RFA in the future if return of right knee pain.  Plan of Care  Pharmacotherapy (Medications Ordered): Meds ordered this encounter  Medications  . diclofenac (VOLTAREN) 75 MG EC tablet    Sig: Take 1 tablet (75 mg total) by mouth 2 (two) times daily.    Dispense:  60 tablet    Refill:  2   Time Note: Greater than 50% of the 25 minute(s) of face-to-face time spent with Lindsay Trevino, was spent in counseling/coordination of care regarding: the appropriate use of the pain scale, Lindsay Trevino's primary cause of pain, the treatment plan, the results, interpretation and significance of  her recent diagnostic interventional treatment(s), realistic expectations, the goals of pain management (increased in functionality) and the need to bring and keep the BMI below 30.  Provider-requested follow-up: Return if symptoms worsen or fail to improve.  Primary Care Physician: Barbette Reichmann, MD Location: [facility] Note by: Edward Jolly, M.D Date: 06/23/2018; Time: 3:31 PM  There are no Patient Instructions on file for this visit.

## 2018-08-17 ENCOUNTER — Other Ambulatory Visit: Payer: Self-pay | Admitting: Unknown Physician Specialty

## 2018-08-17 DIAGNOSIS — M25561 Pain in right knee: Principal | ICD-10-CM

## 2018-08-17 DIAGNOSIS — G8929 Other chronic pain: Secondary | ICD-10-CM

## 2018-08-30 ENCOUNTER — Other Ambulatory Visit: Payer: Self-pay

## 2018-08-30 ENCOUNTER — Ambulatory Visit
Admission: RE | Admit: 2018-08-30 | Discharge: 2018-08-30 | Disposition: A | Discharge status: Home / Self Care | Payer: Managed Care, Other (non HMO) | Source: Ambulatory Visit | Attending: Unknown Physician Specialty | Admitting: Unknown Physician Specialty

## 2018-08-30 DIAGNOSIS — M25561 Pain in right knee: Secondary | ICD-10-CM | POA: Diagnosis not present

## 2018-08-30 DIAGNOSIS — G8929 Other chronic pain: Secondary | ICD-10-CM | POA: Diagnosis present

## 2018-12-01 ENCOUNTER — Other Ambulatory Visit: Payer: Self-pay | Admitting: Student in an Organized Health Care Education/Training Program

## 2019-03-31 ENCOUNTER — Other Ambulatory Visit: Payer: Self-pay | Admitting: Pulmonary Disease

## 2019-03-31 ENCOUNTER — Other Ambulatory Visit (HOSPITAL_COMMUNITY): Payer: Self-pay | Admitting: Pulmonary Disease

## 2019-03-31 DIAGNOSIS — R0602 Shortness of breath: Secondary | ICD-10-CM

## 2019-04-01 ENCOUNTER — Other Ambulatory Visit: Payer: Self-pay

## 2019-04-01 ENCOUNTER — Ambulatory Visit
Admission: RE | Admit: 2019-04-01 | Discharge: 2019-04-01 | Disposition: A | Discharge status: Home / Self Care | Payer: Self-pay | Source: Ambulatory Visit | Attending: Pulmonary Disease | Admitting: Pulmonary Disease

## 2019-04-01 DIAGNOSIS — R0602 Shortness of breath: Secondary | ICD-10-CM | POA: Insufficient documentation

## 2019-04-01 LAB — POCT I-STAT CREATININE: Creatinine, Ser: 0.8 mg/dL (ref 0.44–1.00)

## 2019-04-01 MED ORDER — IOHEXOL 350 MG/ML SOLN
75.0000 mL | Freq: Once | INTRAVENOUS | Status: AC | PRN
  Administered 2019-04-01: 75 mL via INTRAVENOUS

## 2020-07-09 ENCOUNTER — Other Ambulatory Visit
Admission: RE | Admit: 2020-07-09 | Discharge: 2020-07-09 | Disposition: A | Discharge status: Home / Self Care | Payer: 59 | Source: Ambulatory Visit | Attending: Pulmonary Disease | Admitting: Pulmonary Disease

## 2020-07-09 DIAGNOSIS — R0600 Dyspnea, unspecified: Secondary | ICD-10-CM | POA: Diagnosis present

## 2020-07-09 LAB — D-DIMER, QUANTITATIVE: D-Dimer, Quant: 0.67 ug/mL-FEU — ABNORMAL HIGH (ref 0.00–0.50)

## 2021-04-19 DIAGNOSIS — M4316 Spondylolisthesis, lumbar region: Secondary | ICD-10-CM | POA: Diagnosis not present

## 2021-04-19 DIAGNOSIS — M25561 Pain in right knee: Secondary | ICD-10-CM | POA: Diagnosis not present

## 2021-05-06 DIAGNOSIS — R0602 Shortness of breath: Secondary | ICD-10-CM | POA: Diagnosis not present

## 2021-05-06 DIAGNOSIS — I10 Essential (primary) hypertension: Secondary | ICD-10-CM | POA: Diagnosis not present

## 2021-05-06 DIAGNOSIS — M25572 Pain in left ankle and joints of left foot: Secondary | ICD-10-CM | POA: Diagnosis not present

## 2021-05-06 DIAGNOSIS — G4733 Obstructive sleep apnea (adult) (pediatric): Secondary | ICD-10-CM | POA: Diagnosis not present

## 2021-05-06 DIAGNOSIS — R7309 Other abnormal glucose: Secondary | ICD-10-CM | POA: Diagnosis not present

## 2021-05-06 DIAGNOSIS — R0789 Other chest pain: Secondary | ICD-10-CM | POA: Diagnosis not present

## 2021-05-06 DIAGNOSIS — E782 Mixed hyperlipidemia: Secondary | ICD-10-CM | POA: Diagnosis not present

## 2021-05-06 DIAGNOSIS — G8929 Other chronic pain: Secondary | ICD-10-CM | POA: Diagnosis not present

## 2021-05-07 DIAGNOSIS — R8290 Unspecified abnormal findings in urine: Secondary | ICD-10-CM | POA: Diagnosis not present

## 2021-05-13 ENCOUNTER — Other Ambulatory Visit: Payer: Self-pay | Admitting: Internal Medicine

## 2021-05-13 DIAGNOSIS — I10 Essential (primary) hypertension: Secondary | ICD-10-CM | POA: Diagnosis not present

## 2021-05-13 DIAGNOSIS — E559 Vitamin D deficiency, unspecified: Secondary | ICD-10-CM

## 2021-05-13 DIAGNOSIS — M545 Low back pain, unspecified: Secondary | ICD-10-CM | POA: Diagnosis not present

## 2021-05-13 DIAGNOSIS — R232 Flushing: Secondary | ICD-10-CM | POA: Diagnosis not present

## 2021-05-13 DIAGNOSIS — G4733 Obstructive sleep apnea (adult) (pediatric): Secondary | ICD-10-CM | POA: Diagnosis not present

## 2021-05-13 DIAGNOSIS — Z8616 Personal history of COVID-19: Secondary | ICD-10-CM | POA: Diagnosis not present

## 2021-05-13 DIAGNOSIS — L298 Other pruritus: Secondary | ICD-10-CM | POA: Diagnosis not present

## 2021-05-13 DIAGNOSIS — J449 Chronic obstructive pulmonary disease, unspecified: Secondary | ICD-10-CM | POA: Diagnosis not present

## 2021-05-13 DIAGNOSIS — G2581 Restless legs syndrome: Secondary | ICD-10-CM | POA: Diagnosis not present

## 2021-05-13 DIAGNOSIS — Z1231 Encounter for screening mammogram for malignant neoplasm of breast: Secondary | ICD-10-CM | POA: Diagnosis not present

## 2021-05-13 DIAGNOSIS — Z9181 History of falling: Secondary | ICD-10-CM | POA: Diagnosis not present

## 2021-06-18 IMAGING — CT CT ANGIO CHEST
2 of 6 series · 18 of 46 positions shown · IV contrast (APPLIED)
Comparison: Chest CT November 25, 2010

CLINICAL DATA: Shortness of breath with cardiac palpitations

EXAM:
CT ANGIOGRAPHY CHEST WITH CONTRAST
TECHNIQUE: Multidetector CT imaging of the chest was performed using the
standard protocol during bolus administration of intravenous
contrast. Multiplanar CT image reconstructions and MIPs were
obtained to evaluate the vascular anatomy.
CONTRAST:  75mL OMNIPAQUE IOHEXOL 350 MG/ML SOLN

[Series 6: thins · axial · 0.68mm/px · z∈[-286,-26]mm · 16 of 286 slices shown]
[im 13/286  lung]
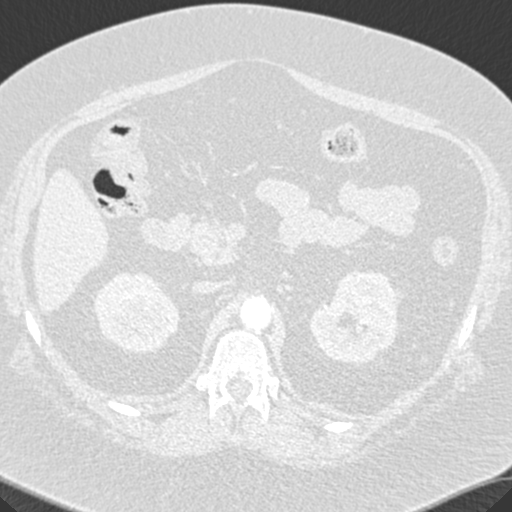
[im 38/286  soft-tissue]
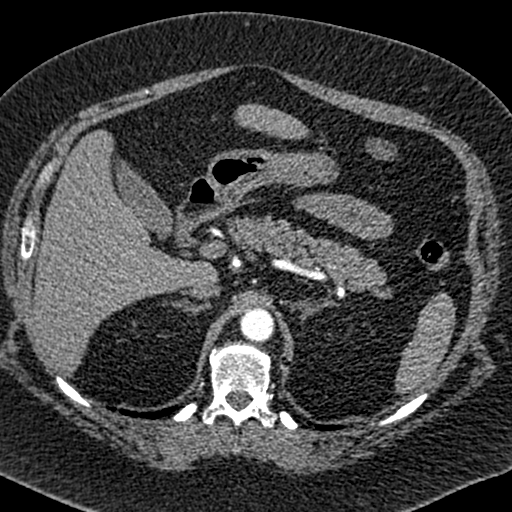
[im 50/286  lung]
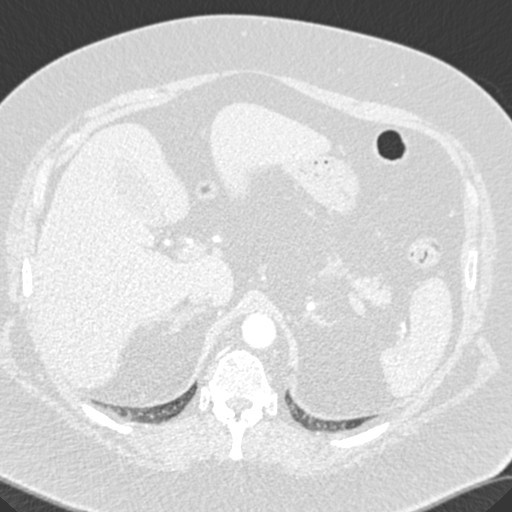
[im 62/286  soft-tissue]
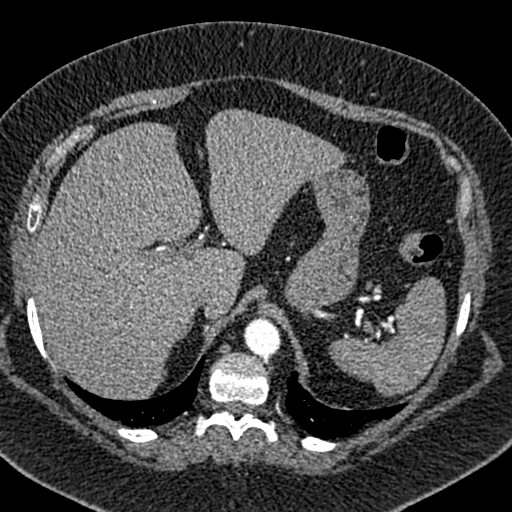
[im 87/286  lung]
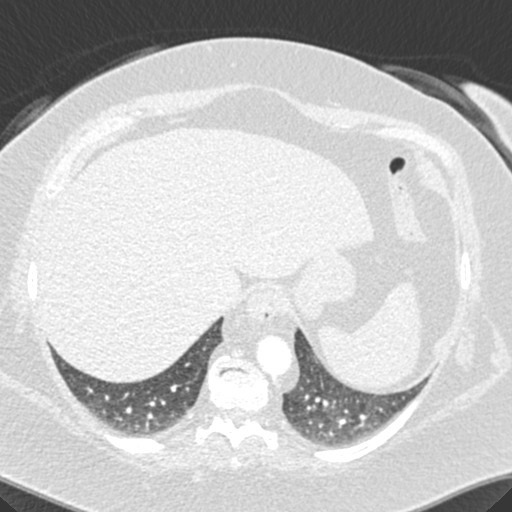
[im 100/286  soft-tissue]
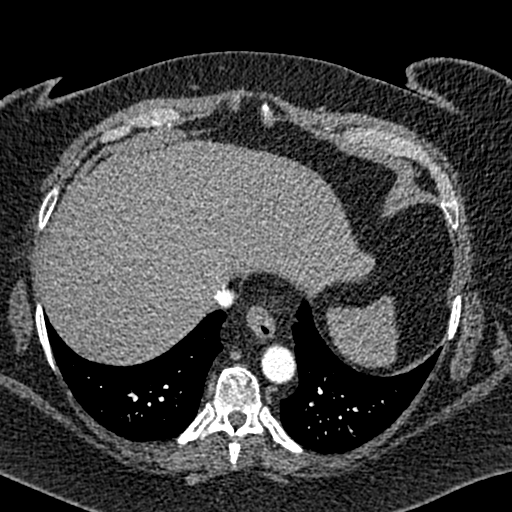
[im 112/286  lung]
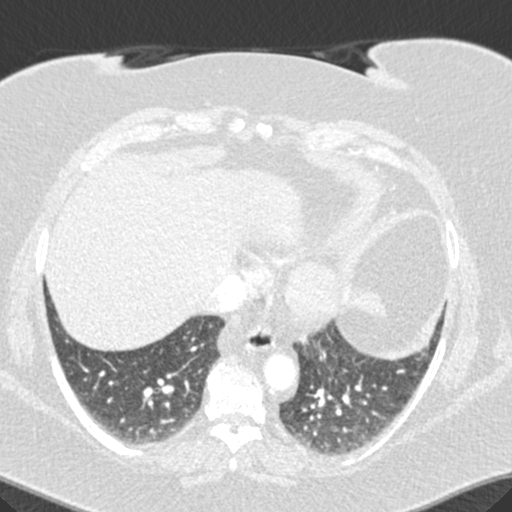
[im 137/286  soft-tissue]
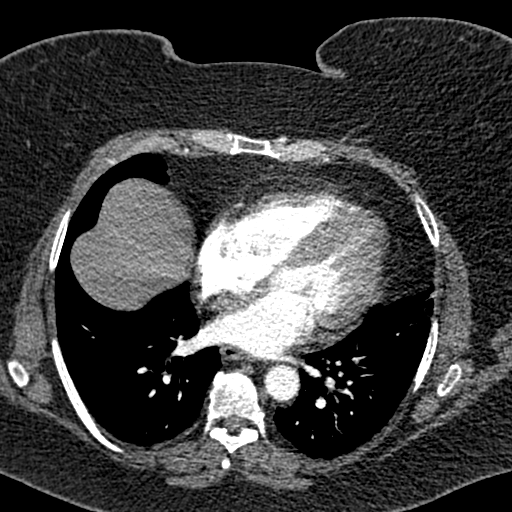
[im 149/286  lung]
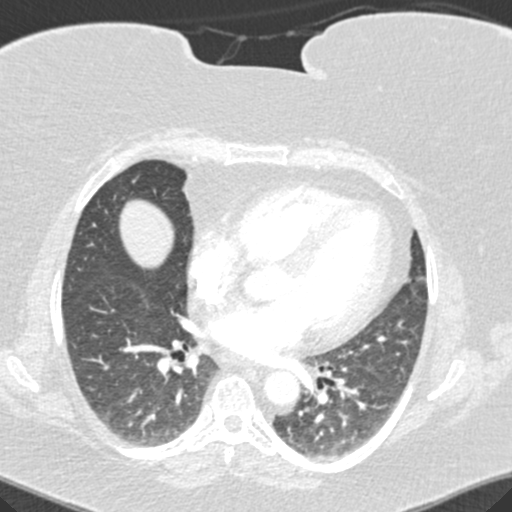
[im 174/286  soft-tissue]
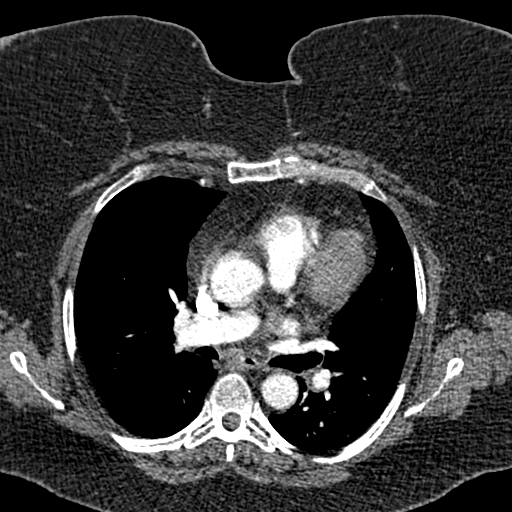
[im 186/286  lung]
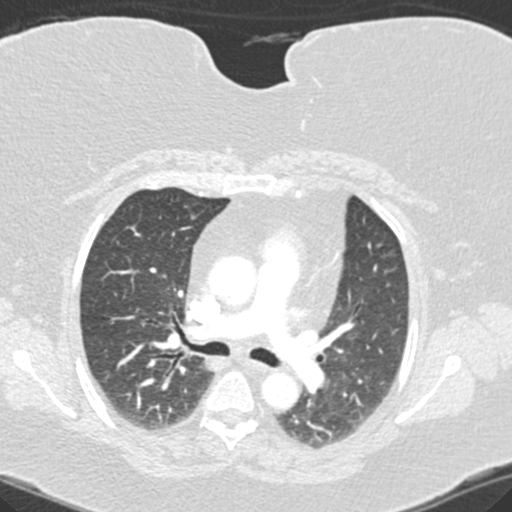
[im 199/286  soft-tissue]
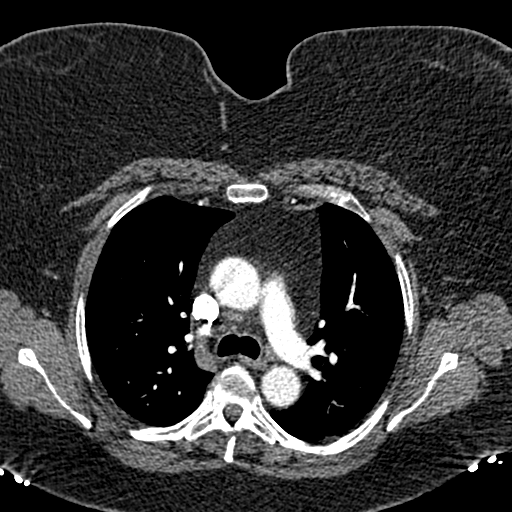
[im 224/286  lung]
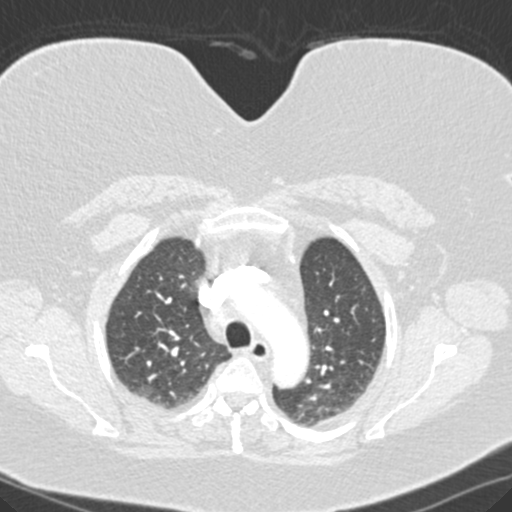
[im 236/286  soft-tissue]
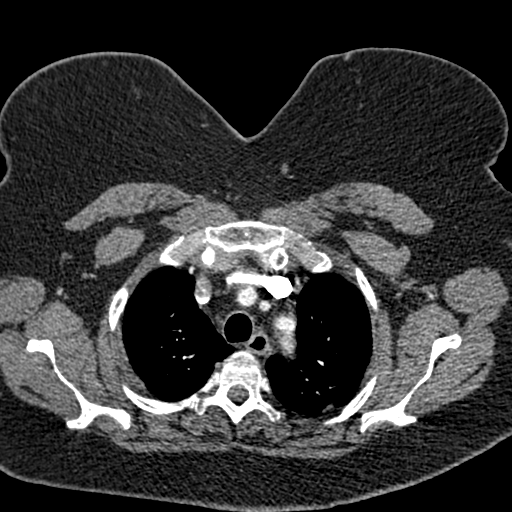
[im 248/286  lung]
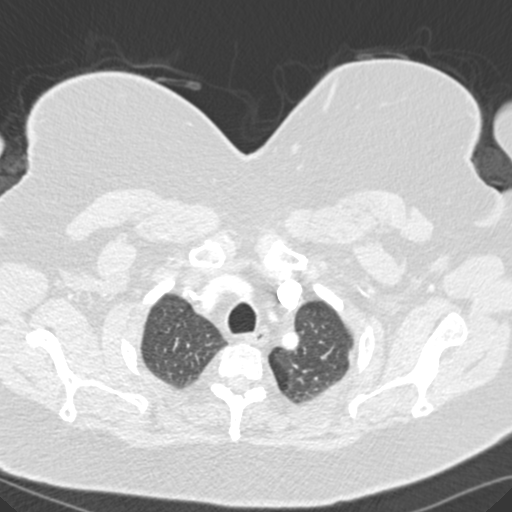
[im 273/286  soft-tissue]
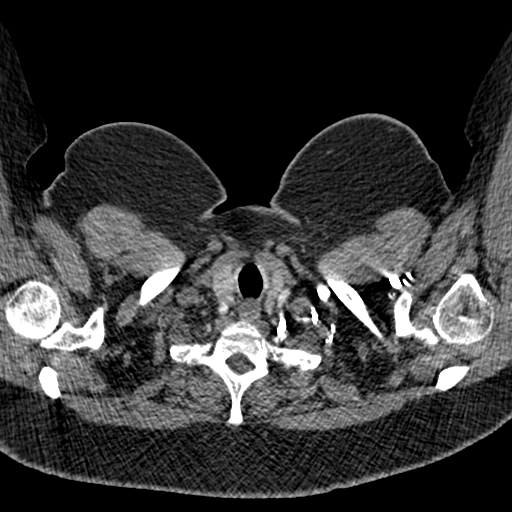

[Series 8: coronal mpr · coronal · 0.56mm/px · 2 of 109 slices shown]
[im 37/109  soft-tissue]
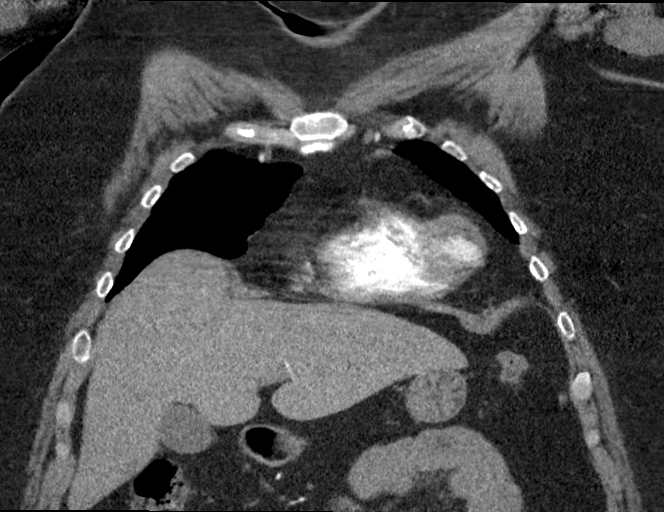
[im 73/109  soft-tissue]
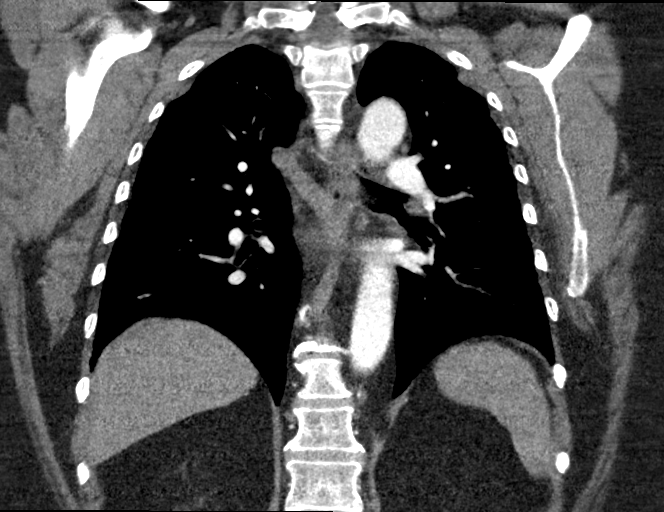

[18 of 46 positions shown; findings below may reference images not displayed]

FINDINGS: Cardiovascular: There is no demonstrable pulmonary embolus. There is
no thoracic aortic aneurysm or dissection. Visualized great vessels
appear normal. There is no pericardial effusion or pericardial
thickening.

Mediastinum/Nodes: Thyroid appears unremarkable. There is moderate
fat throughout the anterior mediastinum. No adenopathy is
demonstrable by size criteria. There are a few subcentimeter
mediastinal lymph nodes. There are no esophageal lesions evident.

Lungs/Pleura: There is no edema or consolidation. There is slight
bibasilar atelectatic change. No pleural effusions are evident.

Upper Abdomen: There is scarring along the posterior aspect of the
left mid kidney. There is a 5 x 3 mm calculus in this area of
scarring in the posterior left kidney. There is a 1 mm calculus in
the upper pole left kidney. Visualized upper abdominal structures
otherwise appear unremarkable.

Musculoskeletal: There are no blastic or lytic bone lesions. No
chest wall lesions are evident.

Review of the MIP images confirms the above findings.
IMPRESSION: 1. No demonstrable pulmonary embolus. No thoracic aortic aneurysm or
dissection.

2. Slight bibasilar atelectasis. No edema or consolidation. No
pleural effusion.

3.  No evident adenopathy.

4. Nonobstructing calculi in the left kidney. Scarring in the
posterior upper to mid left kidney.

## 2021-11-01 DIAGNOSIS — S6991XA Unspecified injury of right wrist, hand and finger(s), initial encounter: Secondary | ICD-10-CM | POA: Diagnosis not present

## 2021-11-01 DIAGNOSIS — Z1331 Encounter for screening for depression: Secondary | ICD-10-CM | POA: Diagnosis not present

## 2021-11-12 DIAGNOSIS — Z6841 Body Mass Index (BMI) 40.0 and over, adult: Secondary | ICD-10-CM | POA: Diagnosis not present

## 2021-11-12 DIAGNOSIS — Z1389 Encounter for screening for other disorder: Secondary | ICD-10-CM | POA: Diagnosis not present

## 2021-11-12 DIAGNOSIS — G4733 Obstructive sleep apnea (adult) (pediatric): Secondary | ICD-10-CM | POA: Diagnosis not present

## 2021-11-12 DIAGNOSIS — I10 Essential (primary) hypertension: Secondary | ICD-10-CM | POA: Diagnosis not present

## 2021-11-12 DIAGNOSIS — R400 Somnolence: Secondary | ICD-10-CM | POA: Diagnosis not present

## 2021-11-12 DIAGNOSIS — R609 Edema, unspecified: Secondary | ICD-10-CM | POA: Diagnosis not present

## 2021-11-12 DIAGNOSIS — G2581 Restless legs syndrome: Secondary | ICD-10-CM | POA: Diagnosis not present

## 2021-11-12 DIAGNOSIS — Z0000 Encounter for general adult medical examination without abnormal findings: Secondary | ICD-10-CM | POA: Diagnosis not present

## 2021-11-13 DIAGNOSIS — R400 Somnolence: Secondary | ICD-10-CM | POA: Diagnosis not present

## 2021-11-13 DIAGNOSIS — Z6841 Body Mass Index (BMI) 40.0 and over, adult: Secondary | ICD-10-CM | POA: Diagnosis not present

## 2021-11-13 DIAGNOSIS — G4733 Obstructive sleep apnea (adult) (pediatric): Secondary | ICD-10-CM | POA: Diagnosis not present

## 2021-11-13 DIAGNOSIS — M79601 Pain in right arm: Secondary | ICD-10-CM | POA: Diagnosis not present

## 2021-11-13 DIAGNOSIS — M1990 Unspecified osteoarthritis, unspecified site: Secondary | ICD-10-CM | POA: Diagnosis not present

## 2021-11-13 DIAGNOSIS — I10 Essential (primary) hypertension: Secondary | ICD-10-CM | POA: Diagnosis not present

## 2021-11-13 DIAGNOSIS — G2581 Restless legs syndrome: Secondary | ICD-10-CM | POA: Diagnosis not present

## 2021-11-13 DIAGNOSIS — R600 Localized edema: Secondary | ICD-10-CM | POA: Diagnosis not present

## 2021-11-13 DIAGNOSIS — R7309 Other abnormal glucose: Secondary | ICD-10-CM | POA: Diagnosis not present

## 2021-11-13 DIAGNOSIS — Z0000 Encounter for general adult medical examination without abnormal findings: Secondary | ICD-10-CM | POA: Diagnosis not present

## 2021-12-04 DIAGNOSIS — Z0100 Encounter for examination of eyes and vision without abnormal findings: Secondary | ICD-10-CM | POA: Diagnosis not present

## 2021-12-04 DIAGNOSIS — H524 Presbyopia: Secondary | ICD-10-CM | POA: Diagnosis not present

## 2022-01-20 DIAGNOSIS — R0602 Shortness of breath: Secondary | ICD-10-CM | POA: Diagnosis not present

## 2022-01-22 DIAGNOSIS — J3489 Other specified disorders of nose and nasal sinuses: Secondary | ICD-10-CM | POA: Diagnosis not present

## 2022-01-30 DIAGNOSIS — M1711 Unilateral primary osteoarthritis, right knee: Secondary | ICD-10-CM | POA: Diagnosis not present

## 2022-01-30 DIAGNOSIS — I10 Essential (primary) hypertension: Secondary | ICD-10-CM | POA: Diagnosis not present

## 2022-01-30 DIAGNOSIS — Z23 Encounter for immunization: Secondary | ICD-10-CM | POA: Diagnosis not present

## 2022-01-30 DIAGNOSIS — U099 Post covid-19 condition, unspecified: Secondary | ICD-10-CM | POA: Diagnosis not present

## 2022-01-30 DIAGNOSIS — R600 Localized edema: Secondary | ICD-10-CM | POA: Diagnosis not present

## 2022-01-30 DIAGNOSIS — Z6841 Body Mass Index (BMI) 40.0 and over, adult: Secondary | ICD-10-CM | POA: Diagnosis not present

## 2022-01-30 DIAGNOSIS — G4733 Obstructive sleep apnea (adult) (pediatric): Secondary | ICD-10-CM | POA: Diagnosis not present

## 2022-01-30 DIAGNOSIS — M545 Low back pain, unspecified: Secondary | ICD-10-CM | POA: Diagnosis not present

## 2022-03-21 DIAGNOSIS — S134XXA Sprain of ligaments of cervical spine, initial encounter: Secondary | ICD-10-CM | POA: Diagnosis not present

## 2022-03-21 DIAGNOSIS — M9904 Segmental and somatic dysfunction of sacral region: Secondary | ICD-10-CM | POA: Diagnosis not present

## 2022-03-21 DIAGNOSIS — S335XXA Sprain of ligaments of lumbar spine, initial encounter: Secondary | ICD-10-CM | POA: Diagnosis not present

## 2022-03-21 DIAGNOSIS — M9901 Segmental and somatic dysfunction of cervical region: Secondary | ICD-10-CM | POA: Diagnosis not present

## 2022-03-25 DIAGNOSIS — M9904 Segmental and somatic dysfunction of sacral region: Secondary | ICD-10-CM | POA: Diagnosis not present

## 2022-03-25 DIAGNOSIS — S335XXA Sprain of ligaments of lumbar spine, initial encounter: Secondary | ICD-10-CM | POA: Diagnosis not present

## 2022-03-25 DIAGNOSIS — M9901 Segmental and somatic dysfunction of cervical region: Secondary | ICD-10-CM | POA: Diagnosis not present

## 2022-03-25 DIAGNOSIS — S134XXA Sprain of ligaments of cervical spine, initial encounter: Secondary | ICD-10-CM | POA: Diagnosis not present

## 2022-04-01 DIAGNOSIS — M9901 Segmental and somatic dysfunction of cervical region: Secondary | ICD-10-CM | POA: Diagnosis not present

## 2022-04-01 DIAGNOSIS — S134XXA Sprain of ligaments of cervical spine, initial encounter: Secondary | ICD-10-CM | POA: Diagnosis not present

## 2022-04-01 DIAGNOSIS — M9904 Segmental and somatic dysfunction of sacral region: Secondary | ICD-10-CM | POA: Diagnosis not present

## 2022-04-01 DIAGNOSIS — S335XXA Sprain of ligaments of lumbar spine, initial encounter: Secondary | ICD-10-CM | POA: Diagnosis not present
